# Patient Record
Sex: Male | Born: 1935 | Race: White | Hispanic: No | Smoking: Former smoker
Health system: Southern US, Community
[De-identification: ages and names within clinical notes are randomized; demographics above are authoritative.]

## PROBLEM LIST (undated history)

## (undated) DIAGNOSIS — N400 Enlarged prostate without lower urinary tract symptoms: Secondary | ICD-10-CM

## (undated) DIAGNOSIS — I10 Essential (primary) hypertension: Secondary | ICD-10-CM

## (undated) DIAGNOSIS — R194 Change in bowel habit: Secondary | ICD-10-CM

## (undated) DIAGNOSIS — D649 Anemia, unspecified: Secondary | ICD-10-CM

## (undated) DIAGNOSIS — D3A019 Benign carcinoid tumor of the small intestine, unspecified portion: Secondary | ICD-10-CM

## (undated) DIAGNOSIS — I7090 Unspecified atherosclerosis: Secondary | ICD-10-CM

## (undated) DIAGNOSIS — M48061 Spinal stenosis, lumbar region without neurogenic claudication: Secondary | ICD-10-CM

## (undated) DIAGNOSIS — K55059 Acute (reversible) ischemia of intestine, part and extent unspecified: Secondary | ICD-10-CM

## (undated) DIAGNOSIS — M1990 Unspecified osteoarthritis, unspecified site: Secondary | ICD-10-CM

## (undated) DIAGNOSIS — N19 Unspecified kidney failure: Secondary | ICD-10-CM

## (undated) HISTORY — DX: Benign prostatic hyperplasia without lower urinary tract symptoms: N40.0

## (undated) HISTORY — PX: SMALL INTESTINE SURGERY: SHX150

## (undated) HISTORY — DX: Unspecified kidney failure: N19

## (undated) HISTORY — DX: Change in bowel habit: R19.4

## (undated) HISTORY — DX: Unspecified osteoarthritis, unspecified site: M19.90

## (undated) HISTORY — DX: Anemia, unspecified: D64.9

## (undated) HISTORY — PX: LUMBAR LAMINECTOMY: SHX95

---

## 1998-04-08 ENCOUNTER — Ambulatory Visit: Admission: RE | Admit: 1998-04-08 | Discharge: 1998-04-08 | Payer: Self-pay | Admitting: Family Medicine

## 2000-07-09 ENCOUNTER — Encounter: Payer: Self-pay | Admitting: Emergency Medicine

## 2000-07-09 ENCOUNTER — Emergency Department (HOSPITAL_COMMUNITY): Admission: EM | Admit: 2000-07-09 | Discharge: 2000-07-09 | Payer: Self-pay | Admitting: Emergency Medicine

## 2000-07-17 ENCOUNTER — Encounter: Admission: RE | Admit: 2000-07-17 | Discharge: 2000-07-17 | Payer: Self-pay | Admitting: Orthopedic Surgery

## 2000-07-17 ENCOUNTER — Encounter: Payer: Self-pay | Admitting: Orthopedic Surgery

## 2001-04-19 ENCOUNTER — Encounter: Payer: Self-pay | Admitting: Neurosurgery

## 2001-04-19 ENCOUNTER — Encounter: Admission: RE | Admit: 2001-04-19 | Discharge: 2001-04-19 | Payer: Self-pay | Admitting: Neurosurgery

## 2001-12-26 ENCOUNTER — Encounter: Payer: Self-pay | Admitting: Neurosurgery

## 2001-12-26 ENCOUNTER — Encounter: Admission: RE | Admit: 2001-12-26 | Discharge: 2001-12-26 | Payer: Self-pay | Admitting: Neurosurgery

## 2002-01-25 ENCOUNTER — Encounter: Admission: RE | Admit: 2002-01-25 | Discharge: 2002-01-25 | Payer: Self-pay | Admitting: Neurosurgery

## 2002-01-25 ENCOUNTER — Encounter: Payer: Self-pay | Admitting: Neurosurgery

## 2002-02-06 ENCOUNTER — Encounter: Admission: RE | Admit: 2002-02-06 | Discharge: 2002-02-06 | Payer: Self-pay | Admitting: Neurosurgery

## 2002-02-06 ENCOUNTER — Encounter: Payer: Self-pay | Admitting: Neurosurgery

## 2002-02-21 ENCOUNTER — Encounter: Payer: Self-pay | Admitting: Neurosurgery

## 2002-02-21 ENCOUNTER — Encounter: Admission: RE | Admit: 2002-02-21 | Discharge: 2002-02-21 | Payer: Self-pay | Admitting: Neurosurgery

## 2003-03-09 ENCOUNTER — Encounter: Admission: RE | Admit: 2003-03-09 | Discharge: 2003-03-09 | Payer: Self-pay | Admitting: Family Medicine

## 2003-03-09 ENCOUNTER — Encounter: Payer: Self-pay | Admitting: Family Medicine

## 2003-11-11 ENCOUNTER — Ambulatory Visit (HOSPITAL_COMMUNITY): Admission: RE | Admit: 2003-11-11 | Discharge: 2003-11-11 | Payer: Self-pay | Admitting: Family Medicine

## 2004-01-03 ENCOUNTER — Ambulatory Visit (HOSPITAL_COMMUNITY): Admission: RE | Admit: 2004-01-03 | Discharge: 2004-01-03 | Payer: Self-pay | Admitting: Neurology

## 2006-04-13 ENCOUNTER — Ambulatory Visit (HOSPITAL_COMMUNITY): Admission: RE | Admit: 2006-04-13 | Discharge: 2006-04-13 | Payer: Self-pay | Admitting: Neurosurgery

## 2006-08-03 ENCOUNTER — Inpatient Hospital Stay (HOSPITAL_COMMUNITY): Admission: RE | Admit: 2006-08-03 | Discharge: 2006-08-04 | Payer: Self-pay | Admitting: Neurosurgery

## 2008-03-20 ENCOUNTER — Encounter: Admission: RE | Admit: 2008-03-20 | Discharge: 2008-03-20 | Payer: Self-pay | Admitting: Neurosurgery

## 2009-04-15 ENCOUNTER — Encounter: Admission: RE | Admit: 2009-04-15 | Discharge: 2009-04-15 | Payer: Self-pay | Admitting: Family Medicine

## 2009-05-15 ENCOUNTER — Ambulatory Visit (HOSPITAL_COMMUNITY): Admission: RE | Admit: 2009-05-15 | Discharge: 2009-05-15 | Payer: Self-pay | Admitting: Family Medicine

## 2009-05-16 ENCOUNTER — Encounter: Admission: RE | Admit: 2009-05-16 | Discharge: 2009-05-28 | Payer: Self-pay | Admitting: Family Medicine

## 2011-01-30 NOTE — Op Note (Signed)
Calvin Harper, Calvin Harper             ACCOUNT NO.:  [id number]   MEDICAL RECORD NO.:  [id number]          PATIENT TYPE:  INP   LOCATION:  3172                         FACILITY:  [facility]   PHYSICIAN:  Reinaldo Meeker, M.D. DATE OF BIRTH:  08/23/1916   DATE OF PROCEDURE:  08/03/2006  DATE OF DISCHARGE:                                 OPERATIVE REPORT   PREOPERATIVE DIAGNOSIS:  Spinal stenosis L4-5.   POSTOPERATIVE DIAGNOSIS:  Spinal stenosis L4-5.   PROCEDURE:  Bilateral L4-5 decompressive laminectomy.   SURGEON:  Reinaldo Meeker, M.D.   ASSISTANT:  Tia Alert, MD   PROCEDURE IN DETAIL:  After placed in the prone position, the patient's back  was prepped and draped in the usual sterile fashion.  Localizing x-rays  taken prior to incision identify the appropriate level.  Midline incision  was made over the spinous process of L4 and L5.  Using Bovie cutting  current, the incision was carried down to spinous processes.  Subperiosteal  dissection was then carried out bilaterally on the spinous processes,  lamina, facet joints.  Self-retaining retractor was placed for exposure.  X-  rays showed approach to the appropriate level.  Using Stille rongeurs,  spinous process interspinous ligament were removed.  Started the patient's  left side laminotomy was performed by removing the inferior one half of the  L4 lamina, medial one third of facet joint.  The superior one half of the L4  lamina.  Residual bone and markedly hypertrophic ligamentum flavum were  removed to complete the decompression of the left side thecal sac and L5  nerve root.  Attention was then turned to the right side where once again,  similar decompression was carried out.  Once again high-speed drill was used  to thin the bone followed by removal of residual bone and ligamentum flavum  with Kerrison punch.  L5 nerve root was once again followed out its foramen.  At this time disks were evaluated and found to be bulging  but not need of  removal.  This time the decompression was felt to be complete.  Large  amounts of the irrigation were carried out.  Any bleeding was controlled  bipolar coagulation and Gelfoam.  The wound was then closed in multiple  layers of Vicryl in the muscle fascia, subcutaneous and subcu tissues and  staples on the skin.  A sterile dressing was then applied and the patient  was extubated taken to recovery room in stable condition.           ______________________________  Reinaldo Meeker, M.D.     ROK/MEDQ  D:  08/03/2006  T:  08/03/2006  Job:  [id number]

## 2011-02-21 ENCOUNTER — Inpatient Hospital Stay (HOSPITAL_COMMUNITY)
Admission: EM | Admit: 2011-02-21 | Discharge: 2011-03-04 | DRG: 329 | Disposition: A | Discharge status: Home Health | Payer: Medicare Other | Attending: Surgery | Admitting: Surgery

## 2011-02-21 ENCOUNTER — Emergency Department (HOSPITAL_COMMUNITY): Payer: Medicare Other

## 2011-02-21 DIAGNOSIS — R5381 Other malaise: Secondary | ICD-10-CM | POA: Diagnosis not present

## 2011-02-21 DIAGNOSIS — C7A012 Malignant carcinoid tumor of the ileum: Secondary | ICD-10-CM | POA: Diagnosis present

## 2011-02-21 DIAGNOSIS — R188 Other ascites: Secondary | ICD-10-CM | POA: Diagnosis present

## 2011-02-21 DIAGNOSIS — K560 Paralytic ileus: Secondary | ICD-10-CM | POA: Diagnosis not present

## 2011-02-21 DIAGNOSIS — K559 Vascular disorder of intestine, unspecified: Principal | ICD-10-CM | POA: Diagnosis present

## 2011-02-21 DIAGNOSIS — F05 Delirium due to known physiological condition: Secondary | ICD-10-CM | POA: Diagnosis not present

## 2011-02-21 DIAGNOSIS — Z79899 Other long term (current) drug therapy: Secondary | ICD-10-CM

## 2011-02-21 DIAGNOSIS — Z781 Physical restraint status: Secondary | ICD-10-CM | POA: Diagnosis not present

## 2011-02-21 DIAGNOSIS — K5900 Constipation, unspecified: Secondary | ICD-10-CM | POA: Diagnosis present

## 2011-02-21 DIAGNOSIS — I2510 Atherosclerotic heart disease of native coronary artery without angina pectoris: Secondary | ICD-10-CM | POA: Diagnosis present

## 2011-02-21 DIAGNOSIS — N289 Disorder of kidney and ureter, unspecified: Secondary | ICD-10-CM | POA: Diagnosis present

## 2011-02-21 DIAGNOSIS — K658 Other peritonitis: Secondary | ICD-10-CM | POA: Diagnosis present

## 2011-02-21 DIAGNOSIS — N400 Enlarged prostate without lower urinary tract symptoms: Secondary | ICD-10-CM | POA: Diagnosis present

## 2011-02-21 DIAGNOSIS — I10 Essential (primary) hypertension: Secondary | ICD-10-CM | POA: Diagnosis present

## 2011-02-21 DIAGNOSIS — R1310 Dysphagia, unspecified: Secondary | ICD-10-CM | POA: Diagnosis present

## 2011-02-21 DIAGNOSIS — I700 Atherosclerosis of aorta: Secondary | ICD-10-CM | POA: Diagnosis present

## 2011-02-21 DIAGNOSIS — Z880 Allergy status to penicillin: Secondary | ICD-10-CM

## 2011-02-21 LAB — URINALYSIS, ROUTINE W REFLEX MICROSCOPIC
Nitrite: NEGATIVE
Protein, ur: NEGATIVE mg/dL
Urobilinogen, UA: 1 mg/dL (ref 0.0–1.0)
pH: 5 (ref 5.0–8.0)

## 2011-02-21 LAB — COMPREHENSIVE METABOLIC PANEL
Alkaline Phosphatase: 73 U/L (ref 39–117)
BUN: 42 mg/dL — ABNORMAL HIGH (ref 6–23)
CO2: 25 mEq/L (ref 19–32)
Chloride: 101 mEq/L (ref 96–112)
Creatinine, Ser: 1.52 mg/dL — ABNORMAL HIGH (ref 0.4–1.5)
GFR calc Af Amer: 52 mL/min — ABNORMAL LOW (ref >60)
GFR calc non Af Amer: 43 mL/min — ABNORMAL LOW (ref >60)
Glucose, Bld: 147 mg/dL — ABNORMAL HIGH (ref 70–99)
Potassium: 4.2 mEq/L (ref 3.5–5.1)
Total Bilirubin: 0.5 mg/dL (ref 0.3–1.2)

## 2011-02-21 LAB — DIFFERENTIAL
Basophils Absolute: 0 10*3/uL (ref 0.0–0.1)
Lymphocytes Relative: 7 % — ABNORMAL LOW (ref 12–46)
Lymphs Abs: 0.9 10*3/uL (ref 0.7–4.0)
Monocytes Absolute: 0.9 10*3/uL (ref 0.1–1.0)
Neutro Abs: 9.8 10*3/uL — ABNORMAL HIGH (ref 1.7–7.7)

## 2011-02-21 LAB — CBC
HCT: 40.3 % (ref 39.0–52.0)
Hemoglobin: 13.1 g/dL (ref 13.0–17.0)
MCV: 92 fL (ref 78.0–100.0)
WBC: 11.6 10*3/uL — ABNORMAL HIGH (ref 4.0–10.5)

## 2011-02-21 LAB — LIPASE, BLOOD: Lipase: 32 U/L (ref 11–59)

## 2011-02-21 LAB — URINE MICROSCOPIC-ADD ON

## 2011-02-21 MED ORDER — IOHEXOL 300 MG/ML  SOLN
80.0000 mL | Freq: Once | INTRAMUSCULAR | Status: AC | PRN
  Administered 2011-02-21: 80 mL via INTRAVENOUS

## 2011-02-22 ENCOUNTER — Other Ambulatory Visit: Payer: Self-pay | Admitting: General Surgery

## 2011-02-22 LAB — BASIC METABOLIC PANEL
BUN: 40 mg/dL — ABNORMAL HIGH (ref 6–23)
Creatinine, Ser: 1.35 mg/dL (ref 0.4–1.5)
GFR calc non Af Amer: 49 mL/min — ABNORMAL LOW (ref >60)
Glucose, Bld: 209 mg/dL — ABNORMAL HIGH (ref 70–99)
Potassium: 4.5 mEq/L (ref 3.5–5.1)

## 2011-02-22 LAB — CBC
HCT: 37.2 % — ABNORMAL LOW (ref 39.0–52.0)
Hemoglobin: 12.1 g/dL — ABNORMAL LOW (ref 13.0–17.0)
MCHC: 32.5 g/dL (ref 30.0–36.0)
MCV: 92.1 fL (ref 78.0–100.0)

## 2011-02-23 ENCOUNTER — Other Ambulatory Visit (INDEPENDENT_AMBULATORY_CARE_PROVIDER_SITE_OTHER): Payer: Self-pay | Admitting: Surgery

## 2011-02-23 LAB — CBC
HCT: 33.5 % — ABNORMAL LOW (ref 39.0–52.0)
MCV: 91 fL (ref 78.0–100.0)
RBC: 3.68 MIL/uL — ABNORMAL LOW (ref 4.22–5.81)
RDW: 14.8 % (ref 11.5–15.5)
WBC: 11 10*3/uL — ABNORMAL HIGH (ref 4.0–10.5)

## 2011-02-23 LAB — COMPREHENSIVE METABOLIC PANEL
BUN: 31 mg/dL — ABNORMAL HIGH (ref 6–23)
CO2: 23 mEq/L (ref 19–32)
Chloride: 103 mEq/L (ref 96–112)
Creatinine, Ser: 1.15 mg/dL (ref 0.4–1.5)
GFR calc non Af Amer: 59 mL/min — ABNORMAL LOW (ref >60)
Total Bilirubin: 0.3 mg/dL (ref 0.3–1.2)

## 2011-02-24 LAB — CBC
MCH: 30.8 pg (ref 26.0–34.0)
Platelets: 183 10*3/uL (ref 150–400)
RBC: 3.89 MIL/uL — ABNORMAL LOW (ref 4.22–5.81)
WBC: 8.6 10*3/uL (ref 4.0–10.5)

## 2011-02-24 LAB — DIFFERENTIAL
Basophils Absolute: 0 10*3/uL (ref 0.0–0.1)
Eosinophils Absolute: 0 10*3/uL (ref 0.0–0.7)
Lymphocytes Relative: 7 % — ABNORMAL LOW (ref 12–46)
Neutrophils Relative %: 85 % — ABNORMAL HIGH (ref 43–77)

## 2011-02-24 LAB — BASIC METABOLIC PANEL
CO2: 24 mEq/L (ref 19–32)
Calcium: 8.8 mg/dL (ref 8.4–10.5)
Chloride: 98 mEq/L (ref 96–112)
Sodium: 129 mEq/L — ABNORMAL LOW (ref 135–145)

## 2011-02-25 LAB — BASIC METABOLIC PANEL
BUN: 13 mg/dL (ref 6–23)
CO2: 29 mEq/L (ref 19–32)
Chloride: 99 mEq/L (ref 96–112)
Creatinine, Ser: 1.01 mg/dL (ref 0.4–1.5)
GFR calc Af Amer: >60 mL/min (ref >60)
Potassium: 4.4 mEq/L (ref 3.5–5.1)

## 2011-02-25 LAB — CBC
HCT: 38.3 % — ABNORMAL LOW (ref 39.0–52.0)
MCV: 91.2 fL (ref 78.0–100.0)
RBC: 4.2 MIL/uL — ABNORMAL LOW (ref 4.22–5.81)
RDW: 14.1 % (ref 11.5–15.5)
WBC: 13.4 10*3/uL — ABNORMAL HIGH (ref 4.0–10.5)

## 2011-02-25 LAB — MAGNESIUM: Magnesium: 2.1 mg/dL (ref 1.5–2.5)

## 2011-02-25 NOTE — Op Note (Signed)
  NAMECODEN, FRANCHI NO.:  [id number]  MEDICAL RECORD NO.:  [id number]  LOCATION:  1227                         FACILITY:  [facility]  PHYSICIAN:  Thornton Park. Daphine Deutscher, MD  DATE OF BIRTH:  [birth year]  DATE OF PROCEDURE:  02/23/2011 DATE OF DISCHARGE:                              OPERATIVE REPORT   PREOPERATIVE INDICATIONS:  This is a 77 year old Caucasian man who underwent E-LAP by Dr. Gaynelle Adu on February 22, 2011, at which time he resected some ischemic small bowel and the jejunum and found a distal mass in his ileum.  The mass was left marked with a suture where there was a puckered scar.  The patient is brought back at this time for E-LAP and reanastomosis.  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  SURGEON:  Thornton Park. Daphine Deutscher, MD  ASSISTANT:  None.  ANESTHESIA:  General endotracheal.  DESCRIPTION OF PROCEDURE:  The patient was taken to room #1 on the evening of February 23, 2011.  Thereafter, general anesthesia was administered.  The wound which had wicks in it was opened by removing the double-stranded PDS and the abdomen was entered.  I ran the bowel and found the 2 ends of the proximal bowel and then went distally and found the puckered mass in the terminal ileum.  I went at that point and described an anastomosis distally by bending the puckered area into a U and going downstream on both legs firing GIA stapler.  I then resected the common defect and also resected the mass with a single application TA-55 blue load.  I resected the mesentery to go with that mass and oversewed with 2-0 silks.  Viable anastomosis was present although I could see some little bit of cyanosis on the tips proximally.  However, it was felt it was viable.  I looked at it in the case and it still remained  viable.  I went upstream to the 2 ends of bowel and first I closed the mesenteric defect with running 2-0 silk completely closing that.  I then aligned the bowel along the antimesenteric borders opening and I did resect a little piece of the of the proximal bowel that looked a little more ischemic with a GIA.  I then lined up to bowel, fired it GIA and close the common defect in 2 layers with 4-0 PDS with interrupted 3-0 silks. Sponge and needle counts were reported as correct.  I then irrigated and closed the abdomen with running double- stranded #1 PDS from either end.  The wound was irrigated and wound was closed in vertical mattress sutures of nylon and with Telfa wicks.  The patient was taken to recovery room in satisfactory condition.     Thornton Park Daphine Deutscher, MD     MBM/MEDQ  D:  02/23/2011  T:  02/23/2011  Job:  [id number]  Electronically Signed by Luretha Murphy MD on 02/25/2011 04:34:42 PM

## 2011-02-26 LAB — BASIC METABOLIC PANEL
CO2: 28 mEq/L (ref 19–32)
Chloride: 101 mEq/L (ref 96–112)
Glucose, Bld: 109 mg/dL — ABNORMAL HIGH (ref 70–99)
Sodium: 135 mEq/L (ref 135–145)

## 2011-02-26 LAB — CBC
Hemoglobin: 11 g/dL — ABNORMAL LOW (ref 13.0–17.0)
MCH: 30.1 pg (ref 26.0–34.0)
MCV: 89.3 fL (ref 78.0–100.0)
Platelets: 207 10*3/uL (ref 150–400)
RBC: 3.66 MIL/uL — ABNORMAL LOW (ref 4.22–5.81)
WBC: 14 10*3/uL — ABNORMAL HIGH (ref 4.0–10.5)

## 2011-02-26 LAB — MAGNESIUM: Magnesium: 2 mg/dL (ref 1.5–2.5)

## 2011-02-26 LAB — PHOSPHORUS: Phosphorus: 1.9 mg/dL — ABNORMAL LOW (ref 2.3–4.6)

## 2011-02-27 ENCOUNTER — Inpatient Hospital Stay (HOSPITAL_COMMUNITY): Payer: Medicare Other

## 2011-02-27 LAB — CBC
HCT: 36 % — ABNORMAL LOW (ref 39.0–52.0)
Hemoglobin: 12.2 g/dL — ABNORMAL LOW (ref 13.0–17.0)
RBC: 4.02 MIL/uL — ABNORMAL LOW (ref 4.22–5.81)

## 2011-02-27 LAB — URINALYSIS, ROUTINE W REFLEX MICROSCOPIC
Bilirubin Urine: NEGATIVE
Leukocytes, UA: NEGATIVE
Nitrite: NEGATIVE
Specific Gravity, Urine: 1.017 (ref 1.005–1.030)
Urobilinogen, UA: 1 mg/dL (ref 0.0–1.0)
pH: 6.5 (ref 5.0–8.0)

## 2011-02-27 LAB — PHOSPHORUS: Phosphorus: 3.3 mg/dL (ref 2.3–4.6)

## 2011-02-27 LAB — MAGNESIUM: Magnesium: 2.2 mg/dL (ref 1.5–2.5)

## 2011-02-28 LAB — CBC
MCH: 29.6 pg (ref 26.0–34.0)
Platelets: 294 10*3/uL (ref 150–400)
RBC: 3.85 MIL/uL — ABNORMAL LOW (ref 4.22–5.81)
WBC: 17.4 10*3/uL — ABNORMAL HIGH (ref 4.0–10.5)

## 2011-02-28 LAB — URINE CULTURE
Colony Count: NO GROWTH
Special Requests: NEGATIVE

## 2011-03-10 ENCOUNTER — Other Ambulatory Visit (INDEPENDENT_AMBULATORY_CARE_PROVIDER_SITE_OTHER): Payer: Self-pay | Admitting: General Surgery

## 2011-03-11 NOTE — Discharge Summary (Signed)
NAMEHAMLET, LASECKI NO.:  [id number]  MEDICAL RECORD NO.:  [id number]  LOCATION:  1524                         FACILITY:  [facility]  PHYSICIAN:  Juanetta Gosling, MDDATE OF BIRTH:  [birth year]  DATE OF ADMISSION:  02/21/2011 DATE OF DISCHARGE:  03/04/2011                              DISCHARGE SUMMARY   PRIMARY CARE PHYSICIAN:  Dr. Lupe Carney.  CONSULTING SURGEONS:  Dr. Gaynelle Adu and Dr. Luretha Murphy.  HISTORY OF PRESENT ILLNESS:  Mr. Rumbold is a pleasant 77 year old gentleman with history of chronic constipation.  He was otherwise doing well when he developed some periumbilical pain.  Pain made him weak all over and became more severe and he called EMS to bring him to the hospital.  He developed some nausea and vomiting in addition to this. He was seen in the emergency department and was worked up,  which workup was abnormal showing some mild renal insufficiency, mild leukocytosis and subsequently imaging showed some portal venous gas, dilatation of the small bowel loops with some wall thickening and pneumatosis concerning for ischemic bowel.  Dr. Andrey Campanile evaluated the patient and felt that the patient would need admission and urgent surgical intervention.  He discussed this at length with the patient and the patient's family and decision was made to proceed to the operating room.  SUMMARY OF HOSPITAL COURSE:  The patient was admitted on February 21, 2011. Late in the evening, he was taken to the operating room and underwent exploratory laparotomy with small bowel resection due to ischemic bowel. The patient was found to have a segment of small bowel that was ischemic that was resected and stapled off.  He was additionally found to have a mass lesion in the distal small bowel.  It was felt that the patient would not be anastomosed at that time but would be closed and taken to the intensive care unit for continued resuscitation.  The patient tolerated  that procedure well.  A day later, the patient was taken back to the operating room by Dr. Daphine Deutscher for a second look exploratory laparotomy.  At this time, the distal small bowel mass was resected, was found to be carcinoid by frozen section.  There were no additional areas of ischemic bowel and therefore anastomosis was created of the 2 ends of the stapled off jejunum.  The patient again tolerated this procedure well and was admitted to the intensive care unit in stable condition. Postoperatively the patient did develop some postoperative ileus that gradually improved.  However, approximately postop day #4, the patient developed some acute delirium possibly secondary to pain medication versus ICU psychosis due to his age.  However, this gradually improved with time, cessation of any narcotic medication.  The patient's mental status recovered to baseline.  He appeared to suffer no significant sequelae.  However, he did suffer some significant deconditioning during his postoperative course.  Therefore, physical therapy and occupational therapy have been consulted.  They felt that the patient could go home. He was appropriate for home health physical therapy.  The patient's family members have agreed to observe him and provide 24-hour assistance of care until he can be independent again.  Therefore arrangements have been  made for home health physical therapy, occupational therapy, rolling walker and home health wound care to continue dressing changes to his abdominal wound.  At this point, the patient is tolerating regular diet and had good bowel function.  He we feel is appropriate for discharge as of today.  DISCHARGE DIAGNOSES: 1. Ischemic small bowel status post partial small bowel resection. 2. Small bowel carcinoid status post resection. 3. Postoperative ileus - resolved. 4. Acute delirium - resolved. 5. Deconditioning.  The patient will continue to receive home health     physical  therapy and occupational therapy. 6. Chronic dysphagia.  The patient had a speech therapy evaluation and     is tolerating mechanical soft diet without too much difficulty and     this has been a chronic problem for him and he will continue on     this diet for the remainder.  DISCHARGE MEDICATIONS:  The patient is going to resume home medications including: 1. Aspirin 81 mg daily. 2. Finasteride 5 mg daily. 3. Metamucil powder p.r.n. constipation. 4. Nasonex nasal spray 2 sprays daily. 5. Tamsulosin 0.4 mg daily. 6. Verapamil SR 240 mg daily. 7. Vitamin D 1 tablet twice daily.  He is encouraged to use Tylenol 1 to 2 tablets q.4-6 h. p.r.n. pain.  He was previously taking naproxen and is advised not to use this but on a sparingly p.r.n. basis.  He is given preprinted discharge instructions and will follow up in approximately 7 to 10 days with Dr. Daphine Deutscher for reevaluation and probable staple removal.     Brayton El, PA-C   ______________________________ Juanetta Gosling, MD    KB/MEDQ  D:  03/04/2011  T:  03/04/2011  Job:  [id number]  Electronically Signed by Brayton El  on 03/09/2011 02:45:05 PM Electronically Signed by Emelia Loron MD on 03/11/2011 08:20:43 PM

## 2011-03-12 ENCOUNTER — Encounter (INDEPENDENT_AMBULATORY_CARE_PROVIDER_SITE_OTHER): Payer: Self-pay | Admitting: Surgery

## 2011-03-12 ENCOUNTER — Ambulatory Visit (INDEPENDENT_AMBULATORY_CARE_PROVIDER_SITE_OTHER): Payer: Medicare Other | Admitting: Surgery

## 2011-03-12 VITALS — BP 106/68 | HR 74 | Temp 97.5°F | Resp 16 | Ht 75.0 in | Wt 164.6 lb

## 2011-03-12 DIAGNOSIS — D3A098 Benign carcinoid tumors of other sites: Secondary | ICD-10-CM

## 2011-03-12 DIAGNOSIS — K559 Vascular disorder of intestine, unspecified: Secondary | ICD-10-CM

## 2011-03-12 DIAGNOSIS — D490 Neoplasm of unspecified behavior of digestive system: Secondary | ICD-10-CM

## 2011-03-12 NOTE — Progress Notes (Signed)
Manpreet Strey is a 77 year old white male who was recently discharged from [facility] after undergoing an exploratory laparotomy by Dr. Salome Arnt removal of ischemic small bowel with a subsequent take back on February 23, 2011 4 a second look exploratory laparotomy, resection of an area of the terminal ileum which proved to be an ileal carcinoid, and re\re anastomosis of the previous resected area done by Dr. Andrey Campanile.The patient has done well postoperatively and despite his 77 years of age.He has tooth 2 areas of h that are open and healing in by secondary intention.These were left that way to heal by secondary intention.  Today I removed his vertical mattresssutures. His daughter will help clean the wound twice a day with proximal and put a light dressing on it. I told him he could eat whatever he wanted to just to chew his food wellto try to avoid aspiration.I will see him back in the office in 6 weeks and followup.  Overall I think Mr. Greenleaf is doing wellafter surgery.

## 2011-03-17 ENCOUNTER — Ambulatory Visit (INDEPENDENT_AMBULATORY_CARE_PROVIDER_SITE_OTHER): Payer: Medicare Other | Admitting: Surgery

## 2011-03-17 ENCOUNTER — Encounter (INDEPENDENT_AMBULATORY_CARE_PROVIDER_SITE_OTHER): Payer: Self-pay | Admitting: Surgery

## 2011-03-17 VITALS — Temp 97.0°F

## 2011-03-17 DIAGNOSIS — Z9889 Other specified postprocedural states: Secondary | ICD-10-CM

## 2011-03-25 NOTE — Op Note (Signed)
NAMEROBEL, WUERTZ NO.:  [id number]  MEDICAL RECORD NO.:  [id number]  LOCATION:  WLED                         FACILITY:  [facility]  PHYSICIAN:  Mary Sella. Andrey Campanile, MD     DATE OF BIRTH:  10-26-1915  DATE OF PROCEDURE:  02/22/2011 DATE OF DISCHARGE:                              OPERATIVE REPORT   PREOPERATIVE DIAGNOSIS: 1. Ischemic small bowel. 2.  POSTOPERATIVE DIAGNOSES: 1. Ischemic small bowel. 2. Distal small bowel mass.  PROCEDURE PERFORMED: 1. Exploratory laparotomy. 2. Small-bowel resection without anastomosis.  SURGEON:  Mary Sella.  Andrey Campanile, M.D.  ASSISTANT SURGEON:  None.  ANESTHESIA:  General.  SPECIMEN:  Mid jejunum, which was sent to pathology.  ESTIMATED BLOOD LOSS:  Minimal.  FINDINGS:  Approximately 50 cm distal to ligament of Treitz, there was a change in the small bowel.  The wall was thickened and hyperemic.  The mesentery was thickened and edematous as well.  The length of this abnormality was about 45 cm, therefore this was resected.  There was also some hyperemia of the bowel wall proximal to this; however, the mesentery was normal, but because the bowel wall was hyperemic, I elected to not reconnect the patient as I found that it would be best to do a second look to monitor for any signs of ongoing ischemia.  In running the small bowel, there is a focal area of stricture secondary to an intrinsic mass within the lumen.  I marked this with a 3-0 silk stitch.  This area will need to be resected when he is brought back to the operating room.  INDICATIONS FOR PROCEDURE:  Patient is a fairly healthy 77 year old gentleman, who has about a 48-50 years history of chronic constipation, who was in his usual state of health until earlier today.  He ate some hamburgers and then within an hour after eating an hamburger, he developed severe periumbilical pain.  It made him extremely weak.  He had never had any pain like that before.  He called  911.  Upon EMS's arrival, he has also had some nausea and vomiting.  He was brought to the ER where he was found to have a white count around 12,000.  He also had an elevated creatinine of around 1.5.  He underwent a CT scan of his abdomen and pelvis, which demonstrated poor venous gas in the left side of liver, ascites and a focal area of mid small bowel that had wall thickening and pneumatosis.  He also had localized peritonitis on exam. I recommended exploratory surgery to the patient and to his family.  He had diffuse calcification in his abdominal vasculature, although the celiac, SMA and IMA were patent.  There is diffuse atherosclerosis, so my working diagnosis that he had showered a piece of plaque into the small bowel mesentery.  We discussed the risks and benefits of surgery including, but not limited to bleeding, infection, injury to surrounding structures, need to return to the operating room for second look, anastomotic stricture, anastomotic leakage, incisional hernia, wound complications, DVT occurrence, perioperative and cardiac events as well as pneumonia and renal failure as well as the possibility of death.  The patient elected to proceed with  surgery.  DESCRIPTION OF PROCEDURE:  After obtaining informed consent, the patient was brought to the operating room and placed supine on the operating table.  General endotracheal anesthesia was established.  Sequential compression devices were placed.  The patient already had a Foley catheter placed in the ER.  He received Invanz prior to skin incision. His abdomen was prepped and draped in usual standard surgical fashion with ChloraPrep.  Surgical time-out was performed.  The mini midline incision was made with the #10 blade.  The subcutaneous tissue was divided with the electrocautery.  The fascia was incised and the abdominal cavity was entered.  I placed an Horticulturist, commercial.  The omentum was lifted up.  He had diffuse  ascites.  It was green-tinged. There was no enteric contents, however.  I lifted up the omentum and started running the ligament of Treitz.  As described above, approximately 50 cm from the ligament Treitz the bowel became hyperemic and the wall became diffusely thickened and the mesentery was thickened in this area as well.  There was biphasic Doppler signal in this area of abnormality in the mesentery; however, the caliber of the signal was different from the rest of the small bowel.  The length of this hyperemic bowel was around 45 cm.  Then the bowel appeared normal.  Upon running the distal small bowel, there is a focal area of stricture in the distal small bowel that was focal.  There was an intrinsic small bowel mass within the lumen.  The colon appeared normal.  The liver appeared normal.  The stomach was distended.  Because of the hyperemic and thickened bowel wall and thickened mesentery, I elected to resect the midportion of the small bowel.  A small rent was made in the mesentery until where the mesentery appeared normal.  I divided bowel with a linear cutter GIA stapler 65-mm blue load.  I then took down the mesentery in a sterile fashion using LigaSure device.  Distally, I divided the small bowel in a similar fashion with a blue load.  The two stapled ends of the small bowel were tacked with a 3-0 silk sutures. The proximal small bowel, there is about 4-6 cm of the wall on one side that appeared hyperemic; however, the mesentery appeared normal. Because of the hyperemic appearance, I elected to leave him stapled off, expecting that he would be best served by having a second look in 48 hours to rule out any ongoing signs of ischemia.  I also elected to leave the distal small bowel mass until his definitive surgery.  The abdomen was copiously irrigated.  I did tack the area of the small bowel stricture with the mass within the lumen with the 3-0 silk suture.  I confirmed  placement of the nasogastric tube in the mid stomach. Seprafilm was then placed on top of the omentum after it had been draped over the small bowel.  I then reapproximated the fascia with two running looped #1 PDS sutures one from above and one from below.  The skin was reapproximated loosely with skin staples and Telfa wicks were placed between the skin staples followed by 4x4s, ABD and tape.  The patient was extubated and taken to recovery room in stable addition.  All needle and instrument counts were correct x2.  There are no immediate complications.  The patient will need to be brought back within the operating room in 48 hours for second look and further evaluation.     Mary Sella.  Andrey Campanile, MD     EMW/MEDQ  D:  02/22/2011  T:  02/22/2011  Job:  [id number]  cc:   August Saucer Dr Clovis Riley  Electronically Signed by Gaynelle Adu M.D. on 03/25/2011 09:03:23 AM

## 2011-03-25 NOTE — H&P (Signed)
**Note Calvin Harper** Calvin Harper NO.:  [id number]  MEDICAL RECORD NO.:  [id number]  LOCATION:  1227                         FACILITY:  [facility]  PHYSICIAN:  Mary Sella. Andrey Campanile, MD     DATE OF BIRTH:  1916-07-02  DATE OF ADMISSION:  02/21/2011 DATE OF DISCHARGE:                             HISTORY & PHYSICAL   ADMITTING SERVICE:  [facility].  PRIMARY CARE PHYSICIAN:  L. Lupe Carney, M.D.  REASON FOR ADMISSION:  Ischemic bowel.  CHIEF COMPLAINT:  Abdominal pain.  HISTORY OF PRESENT ILLNESS:  Mr. Calvin Harper is a very pleasant 77 year old Caucasian male with a history of chronic constipation.  He was in his usual state of health until earlier today.  He decided that he was "going to do something for his chronic constipation," so he went and got some Metamucil and took a dose.  He subsequently had a hamburger a few hours later.  About an hour after eating his hamburger, he developed severe periumbilical pain.  He described it as constant, sharp.  He said it made him weak all over.  He called 911.  When EMS arrived, he had multiple episodes of nausea and emesis.  He was brought to [facility] Emergency Department for evaluation.  He denies any prior symptoms.  He denies any food fear.  He denies any amaurosis fugax or TIAs.  He denies any history of atrial fibrillation.  He denies any weight loss, hematuria, melena, or hematochezia.  He says he has about a bowel movement every 10 days.  It is very hard for him to have a bowel movement.  He has had chronic constipation since returning from [place], he says .  He does endorse some nocturia.  He does not take any blood thinners other than a low-dose aspirin.  PAST MEDICAL HISTORY: 1. Hypertension. 2. BPH. 3. Chronic constipation.  PAST SURGICAL HISTORY: 1. L4-5 laminectomy. 2. Tonsillectomy.  ALLERGIES:  PENICILLIN.  HOME MEDICATIONS: 1. Finasteride. 2. Verapamil. 3. Flomax.  FAMILY HISTORY:   Noncontributory.  SOCIAL HISTORY:  He denies any drugs, alcohol, or tobacco.  He lives by himself.  He still drives.  REVIEW OF SYSTEMS:  He denies any chest pain, shortness of breath, orthopnea, paroxysmal nocturnal dyspnea.  He does endorse some Dupuytren contractures.  Otherwise, a comprehensive 12-point review of systems is negative except what is mentioned in the HPI.  PHYSICAL EXAMINATION:  VITAL SIGNS:  Temperature 97.5, pulse 68, blood pressure 125/50, respirations 24, satting 98% on room air. GENERAL:  White male who is lying in the stretcher, in no apparent distress. HEENT: Atraumatic, normocephalic.  Pupils are equal.  No scleral icterus.  No external ear lesions.  Hearing is decent.  He has got glasses.  He has got some dried vomitus around his mouth.  Mucous membranes look a little bit dry. NECK:  Trachea is midline.  No palpable lymphadenopathy. PULMONARY:  Lungs are clear.  Symmetric chest rise.  No accessory use of muscles. CARDIOVASCULAR:  Regular rhythm.  Palpable radial and femoral pulses, his femoral pulses are quite prominent.  No carotid bruits. ABDOMEN:  Mildly distended, soft.  There are no overt masses.  There are no  hernias.  He has got significant periumbilical pain to palpation, I would characterize it as some localized peritonitis in that area. MUSCULOSKELETAL:  Free range of motion.  Moves all extremities.  He does have a little bit of contracture of some of his fingers on his left hand. SKIN:  He has got multiple bruises in various states  on his forearm. He has got scattered nevi. NEUROLOGIC:  Nonfocal.  Sensation grossly intact. PSYCHIATRIC:  He is alert and oriented x3.  Judgment and insight appear appropriate.  LABORATORY DATA:  Urinalysis showed trace ketones, trace leukocytes, otherwise negative.  Sodium 135, potassium 4.2, chloride 101, bicarb 25, BUN 42, creatinine 1.52, blood sugar 147, calcium 9.9, total bilirubin 0.5, AST 25, ALT 10,  alkaline phosphatase 73, lipase 32.  White count 11.6, hemoglobin 13, hematocrit 40, platelet count 235,000.  RADIOGRAPHS:  CT scan of the abdomen and pelvis showed portal venous gas in the left lobe of liver.  There is mild dilation of small bowel loops with focal midabdominal loop with wall thickening and pneumatosis. There is swollen SMA, but there is calcification of the celiac, SMA, an IMA.  There is a little bit of mesenteric stranding and there is a small amount of fluid in the pelvis.  There is calcification of the aorta without aneurysm.  There is calcification in the coronary arteries.  I personally reviewed the  CT scan myself and discussed it with Radiology.  IMPRESSION:  A 77 year old Caucasian male with hypertension, BPH, chronic constipation, atherosclerotic disease, elevated creatinine, and ischemic bowel.  PLAN:  He has no history of atrial fibrillation.  It is more than likely that the ischemic bowel is due to an atherosclerotic emboli, although there is a potential of other causes.  Nonetheless, I have recommended urgent exploration to the patient and his family, given his pneumatosis, portal venous gas, small bowel wall thickening, elevated white blood cell count, and his clinical exam.  I have recommended exploratory surgery with bowel resection and possible ostomy.  I also explained it is possible that we might not be able to reconnect his intestinal tract tonight and that he might need a diverting ostomy.  I also explained to him that it is potential that he might need a second-look procedure to evaluate for ongoing signs of ischemia.  I have informed that he will be in the ICU postoperatively and it is potential that he may be on the ventilator after surgery.  We will start IV antibiotics on call for surgery and place sequential compression devices.  I had a prolonged discussion with the patient and the family regarding the seriousness of the problem. I  explained to him that surgery was the safest and most prudent option in this instance.  My concern is that if we do not operate tonight that he will perforate and develop multisystem organ failure and certainly have a very significant chance of mortality.  Even if we do operate, he is still at high risk for perioperative complications including bleeding, infection, wound complications, cardiac events, pneumonia, renal failure, and a prolonged hospitalization.  He has elected to proceed to the operating room for surgical intervention.     Mary Sella. Andrey Campanile, MD     EMW/MEDQ  D:  02/21/2011  T:  02/22/2011  Job:  [id number]  cc:   L. Lupe Carney, M.D. Fax: [phone]  Electronically Signed by Gaynelle Adu M.D. on 03/25/2011 09:03:42 AM

## 2011-04-30 ENCOUNTER — Encounter (INDEPENDENT_AMBULATORY_CARE_PROVIDER_SITE_OTHER): Payer: Self-pay | Admitting: Surgery

## 2011-04-30 ENCOUNTER — Ambulatory Visit (INDEPENDENT_AMBULATORY_CARE_PROVIDER_SITE_OTHER): Payer: Medicare Other | Admitting: Surgery

## 2011-04-30 VITALS — Temp 97.2°F

## 2011-04-30 DIAGNOSIS — D3A098 Benign carcinoid tumors of other sites: Secondary | ICD-10-CM

## 2011-04-30 DIAGNOSIS — D490 Neoplasm of unspecified behavior of digestive system: Secondary | ICD-10-CM

## 2011-04-30 NOTE — Progress Notes (Signed)
Calvin Harper returns after his small bowel resection. 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 He has healed remarkably well for his age. His incision looks beautiful. He's having a little problem with diarrhea now but I think that it is likely a reflection of his altered gut bacteria. I'll be glad to see him again whenever needed.

## 2011-05-26 NOTE — Progress Notes (Signed)
The patient comes in with a history of wound drainage.  Wound not red.  Looks like seroma has drained spontaneously.  Will see back at regularly scheduled appointment.

## 2011-06-10 ENCOUNTER — Emergency Department (HOSPITAL_BASED_OUTPATIENT_CLINIC_OR_DEPARTMENT_OTHER)
Admission: EM | Admit: 2011-06-10 | Discharge: 2011-06-10 | Disposition: A | Discharge status: Home / Self Care | Payer: Medicare Other | Attending: Emergency Medicine | Admitting: Emergency Medicine

## 2011-06-10 ENCOUNTER — Encounter (HOSPITAL_BASED_OUTPATIENT_CLINIC_OR_DEPARTMENT_OTHER): Payer: Self-pay | Admitting: *Deleted

## 2011-06-10 DIAGNOSIS — N390 Urinary tract infection, site not specified: Secondary | ICD-10-CM | POA: Insufficient documentation

## 2011-06-10 DIAGNOSIS — I10 Essential (primary) hypertension: Secondary | ICD-10-CM | POA: Insufficient documentation

## 2011-06-10 HISTORY — DX: Essential (primary) hypertension: I10

## 2011-06-10 LAB — BASIC METABOLIC PANEL
CO2: 25 mEq/L (ref 19–32)
Calcium: 10.3 mg/dL (ref 8.4–10.5)
Creatinine, Ser: 1.1 mg/dL (ref 0.50–1.35)
Glucose, Bld: 110 mg/dL — ABNORMAL HIGH (ref 70–99)

## 2011-06-10 LAB — URINALYSIS, ROUTINE W REFLEX MICROSCOPIC
Glucose, UA: NEGATIVE mg/dL
Ketones, ur: NEGATIVE mg/dL
Protein, ur: 100 mg/dL — AB

## 2011-06-10 LAB — CBC
MCH: 30.2 pg (ref 26.0–34.0)
MCHC: 32.8 g/dL (ref 30.0–36.0)
MCV: 92 fL (ref 78.0–100.0)
Platelets: 267 10*3/uL (ref 150–400)
RBC: 3.98 MIL/uL — ABNORMAL LOW (ref 4.22–5.81)

## 2011-06-10 LAB — URINE MICROSCOPIC-ADD ON

## 2011-06-10 MED ORDER — SULFAMETHOXAZOLE-TRIMETHOPRIM 800-160 MG PO TABS: 1.0000 | ORAL_TABLET | Freq: Two times a day (BID) | ORAL | Status: AC

## 2011-06-10 MED ORDER — SULFAMETHOXAZOLE-TMP DS 800-160 MG PO TABS
1.0000 | ORAL_TABLET | Freq: Once | ORAL | Status: AC
  Administered 2011-06-10: 1 via ORAL
  Filled 2011-06-10: qty 1

## 2011-06-10 NOTE — ED Notes (Signed)
Patient states he did not sleep well last night, was up and down multiple time urinating, states he has burning with urination.  States when he got up this am, he did not feel good, was dizzy.  Took his blood pressure and it was 175/80's, confirmed bp with clinic nurse.  States now he feels better.  Just concerned and his PCP is not open today.

## 2011-06-10 NOTE — ED Provider Notes (Signed)
History     CSN: [id number] Arrival date & time: 06/10/2011 12:26 PM  Chief Complaint  Patient presents with  . Hypertension    urinary burning and frequency, dizziness    (Consider location/radiation/quality/duration/timing/severity/associated sxs/prior treatment) HPI Comments: Patient complains of some mild dysuria that started last night. He states that it burned while he was urinating. He also has increased urgency and frequency. He has not had problems with this before. He denies any fevers, nausea, vomiting, changes in bowel habits. He also notes that he simply hasn't felt quite well as he did not sleep well last night. He states that he had some dizziness but when asked what this means he really means that he has a slight headache. He has no associated numbness or weakness. He also checked his blood pressure this morning as he does every morning and noticed that it was elevated in the 160s which is different than his normal readings which are 100-120. He was going to see his primary care physician but he was not in the office today so the nurse sent him here.  Patient is a 77 y.o. male presenting with hypertension. The history is provided by the patient. No language interpreter was used.  Hypertension This is a new problem. The current episode started 12 to 24 hours ago. The problem has been gradually improving. Associated symptoms include headaches. Pertinent negatives include no chest pain, no abdominal pain and no shortness of breath. The symptoms are aggravated by nothing. The symptoms are relieved by nothing. He has tried nothing for the symptoms. The treatment provided no relief.    Past Medical History  Diagnosis Date  . Bowel habit changes     inability to control bowels   . Hypertension     Past Surgical History  Procedure Date  . Small intestine surgery     removal of part of small intestine    No family history on file.  History  Substance Use Topics  . Smoking  status: Former Games developer  . Smokeless tobacco: Not on file  . Alcohol Use: No      Review of Systems  Constitutional: Negative.  Negative for fever and chills.  Eyes: Negative.  Negative for discharge and redness.  Respiratory: Negative.  Negative for cough and shortness of breath.   Cardiovascular: Negative.  Negative for chest pain.  Gastrointestinal: Negative.  Negative for nausea, vomiting and abdominal pain.  Genitourinary: Positive for dysuria, urgency and frequency. Negative for hematuria.  Musculoskeletal: Negative.  Negative for back pain.  Skin: Negative.  Negative for color change and rash.  Neurological: Positive for headaches. Negative for syncope.  Hematological: Negative.  Negative for adenopathy.  Psychiatric/Behavioral: Negative.  Negative for confusion.  All other systems reviewed and are negative.    Allergies  Morphine and related and Penicillins  Home Medications   Current Outpatient Rx  Name Route Sig Dispense Refill  . FINASTERIDE 5 MG PO TABS Oral Take 1 mg by mouth Daily.    Marland Kitchen TAMSULOSIN HCL 0.4 MG PO CAPS      . VERAPAMIL HCL 40 MG PO TABS Oral Take 240 mg by mouth daily.        BP 164/73  Pulse 92  Temp(Src) 97.4 F (36.3 C) (Oral)  Ht 6\' 3"  (1.905 m)  Wt 160 lb (72.576 kg)  BMI 20.00 kg/m2  SpO2 96%  Physical Exam  Constitutional: He is oriented to person, place, and time. He appears well-developed and well-nourished.  Non-toxic appearance. He  does not have a sickly appearance.  HENT:  Head: Normocephalic and atraumatic.  Eyes: Conjunctivae, EOM and lids are normal. Pupils are equal, round, and reactive to light.  Neck: Trachea normal, normal range of motion and full passive range of motion without pain. Neck supple.  Cardiovascular: Normal rate, regular rhythm and normal heart sounds.   Pulmonary/Chest: Effort normal and breath sounds normal. No respiratory distress. He has no wheezes. He has no rales. He exhibits no tenderness.    Abdominal: Soft. Normal appearance. He exhibits no distension. There is no tenderness. There is no rebound and no CVA tenderness.  Musculoskeletal: Normal range of motion.  Neurological: He is alert and oriented to person, place, and time. He has normal strength.  Skin: Skin is warm, dry and intact. No rash noted.  Psychiatric: He has a normal mood and affect. His behavior is normal. Judgment and thought content normal.    ED Course  Procedures (including critical care time)   Results for orders placed during the hospital encounter of 06/10/11  CBC      Component Value Range   WBC 11.3 (*) 4.0 - 10.5 (K/uL)   RBC 3.98 (*) 4.22 - 5.81 (MIL/uL)   Hemoglobin 12.0 (*) 13.0 - 17.0 (g/dL)   HCT 16.1 (*) 09.6 - 52.0 (%)   MCV 92.0  78.0 - 100.0 (fL)   MCH 30.2  26.0 - 34.0 (pg)   MCHC 32.8  30.0 - 36.0 (g/dL)   RDW 04.5  40.9 - 81.1 (%)   Platelets 267  150 - 400 (K/uL)  BASIC METABOLIC PANEL      Component Value Range   Sodium 134 (*) 135 - 145 (mEq/L)   Potassium 3.8  3.5 - 5.1 (mEq/L)   Chloride 97  96 - 112 (mEq/L)   CO2 25  19 - 32 (mEq/L)   Glucose, Bld 110 (*) 70 - 99 (mg/dL)   BUN 33 (*) 6 - 23 (mg/dL)   Creatinine, Ser 9.14  0.50 - 1.35 (mg/dL)   Calcium 78.2  8.4 - 10.5 (mg/dL)   GFR calc non Af Amer >60  >60 (mL/min)   GFR calc Af Amer >60  >60 (mL/min)  URINALYSIS, ROUTINE W REFLEX MICROSCOPIC      Component Value Range   Color, Urine YELLOW  YELLOW    Appearance CLOUDY (*) CLEAR    Specific Gravity, Urine 1.006  1.005 - 1.030    pH 7.0  5.0 - 8.0    Glucose, UA NEGATIVE  NEGATIVE (mg/dL)   Hgb urine dipstick LARGE (*) NEGATIVE    Bilirubin Urine NEGATIVE  NEGATIVE    Ketones, ur NEGATIVE  NEGATIVE (mg/dL)   Protein, ur 956 (*) NEGATIVE (mg/dL)   Urobilinogen, UA 0.2  0.0 - 1.0 (mg/dL)   Nitrite NEGATIVE  NEGATIVE    Leukocytes, UA LARGE (*) NEGATIVE   URINE MICROSCOPIC-ADD ON      Component Value Range   Squamous Epithelial / LPF RARE  RARE    WBC, UA TOO  NUMEROUS TO COUNT  <3 (WBC/hpf)   RBC / HPF 7-10  <3 (RBC/hpf)   Bacteria, UA MANY (*) RARE    No results found.     MDM  Patient with symptoms consistent with UTI. He does demonstrate bacteria and white cells in his urine that correlate with the symptoms and I'll begin treatment with Bactrim. Patient has no  significant systemic symptoms to require admission to the hospital at this point in time. Patient will  have followup with his primary care physician Dr. Clovis Riley. The patient's blood pressures been slightly elevated here this can again be rechecked as an outpatient since patient's previous blood pressures have been quite low up to yesterday. Patient is tolerating by mouth intake and should have no difficulty in maintaining treatment for his urinary tract infection as an outpatient.        Nat Christen, MD 06/10/11 1355

## 2011-06-10 NOTE — ED Notes (Signed)
Walked Pt. To Pharmacy for script fill.  Pt. Walks steady gait.  No distress noted by pt.

## 2011-10-28 ENCOUNTER — Ambulatory Visit (INDEPENDENT_AMBULATORY_CARE_PROVIDER_SITE_OTHER): Payer: Medicare Other | Admitting: Ophthalmology

## 2011-10-28 DIAGNOSIS — H01009 Unspecified blepharitis unspecified eye, unspecified eyelid: Secondary | ICD-10-CM

## 2011-10-28 DIAGNOSIS — H353290 Exudative age-related macular degeneration, unspecified eye, stage unspecified: Secondary | ICD-10-CM

## 2011-10-28 DIAGNOSIS — H2510 Age-related nuclear cataract, unspecified eye: Secondary | ICD-10-CM

## 2011-10-28 DIAGNOSIS — H3530 Unspecified macular degeneration: Secondary | ICD-10-CM

## 2011-10-28 DIAGNOSIS — H43819 Vitreous degeneration, unspecified eye: Secondary | ICD-10-CM

## 2011-10-29 ENCOUNTER — Encounter (INDEPENDENT_AMBULATORY_CARE_PROVIDER_SITE_OTHER): Payer: Medicare Other | Admitting: Ophthalmology

## 2011-10-29 DIAGNOSIS — H353290 Exudative age-related macular degeneration, unspecified eye, stage unspecified: Secondary | ICD-10-CM

## 2011-10-29 DIAGNOSIS — H3530 Unspecified macular degeneration: Secondary | ICD-10-CM

## 2011-11-02 ENCOUNTER — Encounter (INDEPENDENT_AMBULATORY_CARE_PROVIDER_SITE_OTHER): Payer: Medicare Other | Admitting: Ophthalmology

## 2011-11-02 DIAGNOSIS — H353290 Exudative age-related macular degeneration, unspecified eye, stage unspecified: Secondary | ICD-10-CM

## 2011-11-02 DIAGNOSIS — H3530 Unspecified macular degeneration: Secondary | ICD-10-CM

## 2011-11-02 DIAGNOSIS — H43819 Vitreous degeneration, unspecified eye: Secondary | ICD-10-CM

## 2011-11-26 ENCOUNTER — Encounter (INDEPENDENT_AMBULATORY_CARE_PROVIDER_SITE_OTHER): Payer: Medicare Other | Admitting: Ophthalmology

## 2011-11-26 DIAGNOSIS — H2510 Age-related nuclear cataract, unspecified eye: Secondary | ICD-10-CM

## 2011-11-26 DIAGNOSIS — H3530 Unspecified macular degeneration: Secondary | ICD-10-CM

## 2011-11-26 DIAGNOSIS — H43819 Vitreous degeneration, unspecified eye: Secondary | ICD-10-CM

## 2011-11-26 DIAGNOSIS — H353290 Exudative age-related macular degeneration, unspecified eye, stage unspecified: Secondary | ICD-10-CM

## 2011-12-25 ENCOUNTER — Encounter (INDEPENDENT_AMBULATORY_CARE_PROVIDER_SITE_OTHER): Payer: Medicare Other | Admitting: Ophthalmology

## 2011-12-25 DIAGNOSIS — H43819 Vitreous degeneration, unspecified eye: Secondary | ICD-10-CM

## 2011-12-25 DIAGNOSIS — H353290 Exudative age-related macular degeneration, unspecified eye, stage unspecified: Secondary | ICD-10-CM

## 2011-12-25 DIAGNOSIS — H3530 Unspecified macular degeneration: Secondary | ICD-10-CM

## 2012-01-22 ENCOUNTER — Encounter (INDEPENDENT_AMBULATORY_CARE_PROVIDER_SITE_OTHER): Payer: Medicare Other | Admitting: Ophthalmology

## 2012-01-22 DIAGNOSIS — H3530 Unspecified macular degeneration: Secondary | ICD-10-CM

## 2012-01-22 DIAGNOSIS — H2510 Age-related nuclear cataract, unspecified eye: Secondary | ICD-10-CM

## 2012-01-22 DIAGNOSIS — H43819 Vitreous degeneration, unspecified eye: Secondary | ICD-10-CM

## 2012-01-22 DIAGNOSIS — H353290 Exudative age-related macular degeneration, unspecified eye, stage unspecified: Secondary | ICD-10-CM

## 2012-01-25 ENCOUNTER — Encounter (HOSPITAL_BASED_OUTPATIENT_CLINIC_OR_DEPARTMENT_OTHER): Payer: Self-pay | Admitting: *Deleted

## 2012-01-25 ENCOUNTER — Emergency Department (HOSPITAL_BASED_OUTPATIENT_CLINIC_OR_DEPARTMENT_OTHER)
Admission: EM | Admit: 2012-01-25 | Discharge: 2012-01-25 | Disposition: A | Discharge status: Home / Self Care | Payer: Medicare Other | Attending: Emergency Medicine | Admitting: Emergency Medicine

## 2012-01-25 DIAGNOSIS — J329 Chronic sinusitis, unspecified: Secondary | ICD-10-CM | POA: Insufficient documentation

## 2012-01-25 DIAGNOSIS — I10 Essential (primary) hypertension: Secondary | ICD-10-CM | POA: Insufficient documentation

## 2012-01-25 DIAGNOSIS — J3489 Other specified disorders of nose and nasal sinuses: Secondary | ICD-10-CM | POA: Insufficient documentation

## 2012-01-25 DIAGNOSIS — R059 Cough, unspecified: Secondary | ICD-10-CM | POA: Insufficient documentation

## 2012-01-25 DIAGNOSIS — R040 Epistaxis: Secondary | ICD-10-CM | POA: Insufficient documentation

## 2012-01-25 DIAGNOSIS — M549 Dorsalgia, unspecified: Secondary | ICD-10-CM | POA: Insufficient documentation

## 2012-01-25 DIAGNOSIS — Z7982 Long term (current) use of aspirin: Secondary | ICD-10-CM | POA: Insufficient documentation

## 2012-01-25 DIAGNOSIS — Z79899 Other long term (current) drug therapy: Secondary | ICD-10-CM | POA: Insufficient documentation

## 2012-01-25 DIAGNOSIS — R51 Headache: Secondary | ICD-10-CM | POA: Insufficient documentation

## 2012-01-25 DIAGNOSIS — R05 Cough: Secondary | ICD-10-CM | POA: Insufficient documentation

## 2012-01-25 HISTORY — DX: Unspecified atherosclerosis: I70.90

## 2012-01-25 MED ORDER — CETIRIZINE HCL 10 MG PO TABS: 10.0000 mg | ORAL_TABLET | Freq: Every day | ORAL | Status: DC

## 2012-01-25 NOTE — ED Notes (Signed)
Patient states he has had sinus congestion for the last 2 months.  Hx of same all of his life.  S/O sinus headache and for the last 3 weeks has had intermittent nose bleed.  Using saline nasal spray with some relief.

## 2012-01-25 NOTE — ED Provider Notes (Signed)
History     CSN: [id number]  Arrival date & time 01/25/12  1005   First MD Initiated Contact with Patient 01/25/12 1106      Chief Complaint  Patient presents with  . URI    (Consider location/radiation/quality/duration/timing/severity/associated sxs/prior treatment) Patient is a 78 y.o. male presenting with URI. The history is provided by the patient.  URI The primary symptoms include headaches and cough. Primary symptoms do not include fever, ear pain, sore throat, wheezing, abdominal pain, nausea, vomiting or rash. The current episode started more than 1 week ago. This is a chronic problem. The problem has been gradually worsening.  Symptoms associated with the illness include plugged ear sensation, sinus pressure and congestion.   Symptoms are more related to sinus pain which is intermittent at times very sharp associated with the headache but it again is also intermittent but having problems for the last 2 months occasional nosebleed but not bleeding now relieved mostly by saline nasal spray also patient was using Nasonex that was provided by his primary care doctor Dr. Clovis Riley. Patient has been having trouble with this for years predominantly during allergy times of the year. Past Medical History  Diagnosis Date  . Bowel habit changes     inability to control bowels   . Hypertension   . Vascular degeneration     Past Surgical History  Procedure Date  . Small intestine surgery     removal of part of small intestine    No family history on file.  History  Substance Use Topics  . Smoking status: Former Games developer  . Smokeless tobacco: Not on file  . Alcohol Use: No      Review of Systems  Constitutional: Negative for fever.  HENT: Positive for nosebleeds, congestion and sinus pressure. Negative for ear pain, sore throat and neck pain.   Respiratory: Positive for cough. Negative for shortness of breath and wheezing.   Cardiovascular: Negative for chest pain.    Gastrointestinal: Negative for nausea, vomiting and abdominal pain.  Genitourinary: Negative for dysuria.  Musculoskeletal: Positive for back pain.  Skin: Negative for rash.  Neurological: Positive for headaches.  Hematological: Does not bruise/bleed easily.    Allergies  Morphine and related and Penicillins  Home Medications   Current Outpatient Rx  Name Route Sig Dispense Refill  . ASPIRIN 81 MG PO TABS Oral Take 81 mg by mouth daily.    Marland Kitchen CETIRIZINE HCL 10 MG PO TABS Oral Take 1 tablet (10 mg total) by mouth daily. 7 tablet 1  . FINASTERIDE 5 MG PO TABS Oral Take 1 mg by mouth Daily.    Marland Kitchen TAMSULOSIN HCL 0.4 MG PO CAPS      . VERAPAMIL HCL 40 MG PO TABS Oral Take 240 mg by mouth daily.        BP 162/74  Pulse 81  Temp(Src) 96.5 F (35.8 C) (Oral)  Resp 18  Ht 6\' 3"  (1.905 m)  Wt 160 lb (72.576 kg)  BMI 20.00 kg/m2  SpO2 100%  Physical Exam  Nursing note and vitals reviewed. Constitutional: He is oriented to person, place, and time. He appears well-developed and well-nourished. No distress.  HENT:  Head: Normocephalic and atraumatic.  Right Ear: External ear normal.  Left Ear: External ear normal.  Mouth/Throat: Oropharynx is clear and moist.       Left ear with a little bit of cerumen it is not impacted. Able to see part of the tympanic membrane.  Eyes: Conjunctivae and EOM  are normal. Pupils are equal, round, and reactive to light.  Neck: Normal range of motion. Neck supple.  Pulmonary/Chest: Effort normal and breath sounds normal. No respiratory distress. He has no wheezes. He has no rales.  Abdominal: Soft. Bowel sounds are normal. There is no tenderness.  Musculoskeletal: Normal range of motion. He exhibits no edema.  Neurological: He is alert and oriented to person, place, and time. No cranial nerve deficit. He exhibits normal muscle tone. Coordination normal.  Skin: Skin is warm. No rash noted. No erythema.    ED Course  Procedures (including critical care  time)  Labs Reviewed - No data to display No results found.   1. Chronic sinusitis       MDM   Patient with history of chronic sinus problems already using Nasonex spray provided by his primary care doctor most of his problems are during the allergy season. Will give a trial of Zyrtec although it may be too strong for his age patient knows to stop it we'll try one tablet. Patient prefers to followup with primary care doctor for referral to ear nose and throat which is okay I asked. Ears are normal sinuses are nontender no fever no evidence of acute sinusitis. Patient states his sinus problems are usually during allergy season.        Shelda Jakes, MD 01/25/12 1149

## 2012-01-25 NOTE — Discharge Instructions (Signed)
Sinusitis Sinusitis an infection of the air pockets (sinuses) in your face. This can cause puffiness (swelling). It can also cause drainage from your sinuses.  HOME CARE   Only take medicine as told by your doctor.   Drink enough fluids to keep your pee (urine) clear or pale yellow.   Apply moist heat or ice packs for pain relief.   Use salt (saline) nose sprays. The spray will wet the thick fluid in the nose. This can help the sinuses drain.  GET HELP RIGHT AWAY IF:   You have a fever.   Your baby is older than 3 months with a rectal temperature of 102 F (38.9 C) or higher.   Your baby is 71 months old or younger with a rectal temperature of 100.4 F (38 C) or higher.   The pain gets worse.   You get a very bad headache.   You keep throwing up (vomiting).   Your face gets puffy.  MAKE SURE YOU:   Understand these instructions.   Will watch your condition.   Will get help right away if you are not doing well or get worse.  Document Released: 02/17/2008 Document Revised: 08/20/2011 Document Reviewed: 02/17/2008 [facility] Patient Information 2012 [place], [place].  Followup with your primary care Dr. make an appointment in the next few days. Would recommend ear nose and throat referral your preferences to have him make the referral. Trial of Zyrtec it may be too strong for you if so stopped it otherwise it may help with a chronic sinus problem.

## 2012-02-24 ENCOUNTER — Encounter (INDEPENDENT_AMBULATORY_CARE_PROVIDER_SITE_OTHER): Payer: Medicare Other | Admitting: Ophthalmology

## 2012-02-24 DIAGNOSIS — H2510 Age-related nuclear cataract, unspecified eye: Secondary | ICD-10-CM

## 2012-02-24 DIAGNOSIS — H3530 Unspecified macular degeneration: Secondary | ICD-10-CM

## 2012-02-24 DIAGNOSIS — H43819 Vitreous degeneration, unspecified eye: Secondary | ICD-10-CM

## 2012-02-24 DIAGNOSIS — I10 Essential (primary) hypertension: Secondary | ICD-10-CM

## 2012-02-24 DIAGNOSIS — H35039 Hypertensive retinopathy, unspecified eye: Secondary | ICD-10-CM

## 2012-02-24 DIAGNOSIS — H353290 Exudative age-related macular degeneration, unspecified eye, stage unspecified: Secondary | ICD-10-CM

## 2012-04-13 ENCOUNTER — Encounter (INDEPENDENT_AMBULATORY_CARE_PROVIDER_SITE_OTHER): Payer: Medicare Other | Admitting: Ophthalmology

## 2012-04-13 DIAGNOSIS — I10 Essential (primary) hypertension: Secondary | ICD-10-CM

## 2012-04-13 DIAGNOSIS — H43819 Vitreous degeneration, unspecified eye: Secondary | ICD-10-CM

## 2012-04-13 DIAGNOSIS — H2510 Age-related nuclear cataract, unspecified eye: Secondary | ICD-10-CM

## 2012-04-13 DIAGNOSIS — H35039 Hypertensive retinopathy, unspecified eye: Secondary | ICD-10-CM

## 2012-04-13 DIAGNOSIS — H3530 Unspecified macular degeneration: Secondary | ICD-10-CM

## 2012-04-13 DIAGNOSIS — H353290 Exudative age-related macular degeneration, unspecified eye, stage unspecified: Secondary | ICD-10-CM

## 2012-06-08 ENCOUNTER — Encounter (INDEPENDENT_AMBULATORY_CARE_PROVIDER_SITE_OTHER): Payer: Medicare Other | Admitting: Ophthalmology

## 2012-06-08 DIAGNOSIS — H35039 Hypertensive retinopathy, unspecified eye: Secondary | ICD-10-CM

## 2012-06-08 DIAGNOSIS — H3530 Unspecified macular degeneration: Secondary | ICD-10-CM

## 2012-06-08 DIAGNOSIS — H43819 Vitreous degeneration, unspecified eye: Secondary | ICD-10-CM

## 2012-06-08 DIAGNOSIS — I10 Essential (primary) hypertension: Secondary | ICD-10-CM

## 2012-06-08 DIAGNOSIS — H353290 Exudative age-related macular degeneration, unspecified eye, stage unspecified: Secondary | ICD-10-CM

## 2012-06-08 DIAGNOSIS — H2510 Age-related nuclear cataract, unspecified eye: Secondary | ICD-10-CM

## 2012-08-03 ENCOUNTER — Encounter (INDEPENDENT_AMBULATORY_CARE_PROVIDER_SITE_OTHER): Payer: Medicare Other | Admitting: Ophthalmology

## 2012-08-03 DIAGNOSIS — H2510 Age-related nuclear cataract, unspecified eye: Secondary | ICD-10-CM

## 2012-08-03 DIAGNOSIS — H35039 Hypertensive retinopathy, unspecified eye: Secondary | ICD-10-CM

## 2012-08-03 DIAGNOSIS — I10 Essential (primary) hypertension: Secondary | ICD-10-CM

## 2012-08-03 DIAGNOSIS — H3530 Unspecified macular degeneration: Secondary | ICD-10-CM

## 2012-08-03 DIAGNOSIS — H43819 Vitreous degeneration, unspecified eye: Secondary | ICD-10-CM

## 2012-08-03 DIAGNOSIS — H353290 Exudative age-related macular degeneration, unspecified eye, stage unspecified: Secondary | ICD-10-CM

## 2012-09-30 ENCOUNTER — Encounter (HOSPITAL_BASED_OUTPATIENT_CLINIC_OR_DEPARTMENT_OTHER): Payer: Self-pay | Admitting: Emergency Medicine

## 2012-09-30 ENCOUNTER — Emergency Department (HOSPITAL_BASED_OUTPATIENT_CLINIC_OR_DEPARTMENT_OTHER)
Admission: EM | Admit: 2012-09-30 | Discharge: 2012-09-30 | Disposition: A | Discharge status: Home / Self Care | Payer: Medicare Other | Attending: Emergency Medicine | Admitting: Emergency Medicine

## 2012-09-30 DIAGNOSIS — Z87891 Personal history of nicotine dependence: Secondary | ICD-10-CM | POA: Insufficient documentation

## 2012-09-30 DIAGNOSIS — Z79899 Other long term (current) drug therapy: Secondary | ICD-10-CM | POA: Insufficient documentation

## 2012-09-30 DIAGNOSIS — K5900 Constipation, unspecified: Secondary | ICD-10-CM

## 2012-09-30 DIAGNOSIS — Z7982 Long term (current) use of aspirin: Secondary | ICD-10-CM | POA: Insufficient documentation

## 2012-09-30 DIAGNOSIS — I10 Essential (primary) hypertension: Secondary | ICD-10-CM | POA: Insufficient documentation

## 2012-09-30 DIAGNOSIS — Z8679 Personal history of other diseases of the circulatory system: Secondary | ICD-10-CM | POA: Insufficient documentation

## 2012-09-30 MED ORDER — POLYETHYLENE GLYCOL 3350 17 GM/SCOOP PO POWD: 17.0000 g | Freq: Every day | ORAL | Status: DC

## 2012-09-30 NOTE — ED Notes (Signed)
Pt has been constipated for 10 days.  Yesterday took a laxative. No success. Does continue to pass gas.

## 2012-09-30 NOTE — ED Notes (Signed)
Pt states he had intestinal surgery about 2 years ago and wonders if this constipation may be related.

## 2012-09-30 NOTE — ED Notes (Signed)
MD at bedside. 

## 2012-09-30 NOTE — ED Provider Notes (Signed)
History     CSN: [id number]  Arrival date & time 09/30/12  1035   First MD Initiated Contact with Patient 09/30/12 1401      Chief Complaint  Patient presents with  . Constipation    (Consider location/radiation/quality/duration/timing/severity/associated sxs/prior treatment) HPI Pt states he has had chronic diarrhea but usually has BM after 3-4 days. It has now been 10 days since last BM. He is having no pain or distention. No N/V, fever. He is passing gas regularly. He has only intermittently used at home laxatives. Pt denies taking narcotic pain meds.  Past Medical History  Diagnosis Date  . Bowel habit changes     inability to control bowels   . Hypertension   . Vascular degeneration     Past Surgical History  Procedure Date  . Small intestine surgery     removal of part of small intestine    History reviewed. No pertinent family history.  History  Substance Use Topics  . Smoking status: Former Games developer  . Smokeless tobacco: Not on file  . Alcohol Use: No      Review of Systems  Constitutional: Negative for fever and chills.  Respiratory: Negative for shortness of breath.   Cardiovascular: Negative for chest pain.  Gastrointestinal: Positive for constipation. Negative for nausea, vomiting, abdominal pain, diarrhea, blood in stool, abdominal distention and anal bleeding.  Genitourinary: Negative for dysuria and flank pain.  Musculoskeletal: Negative for myalgias and back pain.  Neurological: Negative for dizziness, weakness, light-headedness, numbness and headaches.    Allergies  Morphine and related and Penicillins  Home Medications   Current Outpatient Rx  Name  Route  Sig  Dispense  Refill  . ASPIRIN 81 MG PO TABS   Oral   Take 81 mg by mouth daily.         Marland Kitchen CETIRIZINE HCL 10 MG PO TABS   Oral   Take 1 tablet (10 mg total) by mouth daily.   7 tablet   1   . FINASTERIDE 5 MG PO TABS   Oral   Take 1 mg by mouth Daily.         Marland Kitchen  POLYETHYLENE GLYCOL 3350 PO POWD   Oral   Take 17 g by mouth daily.   255 g   0   . TAMSULOSIN HCL 0.4 MG PO CAPS               . VERAPAMIL HCL 40 MG PO TABS   Oral   Take 240 mg by mouth daily.             BP 146/85  Pulse 84  Temp 97.5 F (36.4 C) (Oral)  Resp 12  Ht 6\' 3"  (1.905 m)  Wt 151 lb (68.493 kg)  BMI 18.87 kg/m2  SpO2 98%  Physical Exam  Nursing note and vitals reviewed. Constitutional: He is oriented to person, place, and time. He appears well-developed and well-nourished. No distress.       Very well appearing. Walking around room.   HENT:  Head: Normocephalic and atraumatic.  Mouth/Throat: Oropharynx is clear and moist.  Eyes: EOM are normal. Pupils are equal, round, and reactive to light.  Neck: Normal range of motion. Neck supple.  Cardiovascular: Normal rate and regular rhythm.   Pulmonary/Chest: Effort normal and breath sounds normal. No respiratory distress. He has no wheezes. He has no rales.  Abdominal: Soft. Bowel sounds are normal. He exhibits no distension and no mass. There is no tenderness.  There is no rebound and no guarding.  Musculoskeletal: Normal range of motion. He exhibits no edema and no tenderness.  Neurological: He is alert and oriented to person, place, and time.  Skin: Skin is warm and dry. No rash noted. No erythema.  Psychiatric: He has a normal mood and affect. His behavior is normal.    ED Course  Procedures (including critical care time)  Labs Reviewed - No data to display No results found.   1. Constipation       MDM  Pt reassured and will start on Miralax. Pt encouraged to return for abd pain, fever, chills, inability to pass gas or any concerns        Loren Racer, MD 09/30/12 1447

## 2012-10-05 ENCOUNTER — Encounter (INDEPENDENT_AMBULATORY_CARE_PROVIDER_SITE_OTHER): Payer: Medicare Other | Admitting: Ophthalmology

## 2012-10-06 ENCOUNTER — Encounter (INDEPENDENT_AMBULATORY_CARE_PROVIDER_SITE_OTHER): Payer: Medicare Other | Admitting: Ophthalmology

## 2012-10-12 ENCOUNTER — Encounter (INDEPENDENT_AMBULATORY_CARE_PROVIDER_SITE_OTHER): Payer: Medicare Other | Admitting: Ophthalmology

## 2012-10-19 ENCOUNTER — Encounter (INDEPENDENT_AMBULATORY_CARE_PROVIDER_SITE_OTHER): Payer: Medicare Other | Admitting: Ophthalmology

## 2012-10-19 DIAGNOSIS — H43819 Vitreous degeneration, unspecified eye: Secondary | ICD-10-CM

## 2012-10-19 DIAGNOSIS — H2510 Age-related nuclear cataract, unspecified eye: Secondary | ICD-10-CM

## 2012-10-19 DIAGNOSIS — H3530 Unspecified macular degeneration: Secondary | ICD-10-CM

## 2012-10-19 DIAGNOSIS — I10 Essential (primary) hypertension: Secondary | ICD-10-CM

## 2012-10-19 DIAGNOSIS — H353290 Exudative age-related macular degeneration, unspecified eye, stage unspecified: Secondary | ICD-10-CM

## 2012-10-19 DIAGNOSIS — H35039 Hypertensive retinopathy, unspecified eye: Secondary | ICD-10-CM

## 2012-11-19 ENCOUNTER — Emergency Department (HOSPITAL_COMMUNITY): Payer: Medicare Other

## 2012-11-19 ENCOUNTER — Encounter (HOSPITAL_COMMUNITY): Payer: Self-pay

## 2012-11-19 ENCOUNTER — Inpatient Hospital Stay (HOSPITAL_COMMUNITY)
Admission: EM | Admit: 2012-11-19 | Discharge: 2012-11-21 | DRG: 312 | Disposition: A | Discharge status: Home / Self Care | Payer: Medicare Other | Attending: Internal Medicine | Admitting: Internal Medicine

## 2012-11-19 DIAGNOSIS — Z9049 Acquired absence of other specified parts of digestive tract: Secondary | ICD-10-CM

## 2012-11-19 DIAGNOSIS — D649 Anemia, unspecified: Secondary | ICD-10-CM | POA: Diagnosis present

## 2012-11-19 DIAGNOSIS — R195 Other fecal abnormalities: Secondary | ICD-10-CM | POA: Diagnosis present

## 2012-11-19 DIAGNOSIS — Z79899 Other long term (current) drug therapy: Secondary | ICD-10-CM

## 2012-11-19 DIAGNOSIS — M48061 Spinal stenosis, lumbar region without neurogenic claudication: Secondary | ICD-10-CM | POA: Diagnosis present

## 2012-11-19 DIAGNOSIS — I129 Hypertensive chronic kidney disease with stage 1 through stage 4 chronic kidney disease, or unspecified chronic kidney disease: Secondary | ICD-10-CM | POA: Diagnosis present

## 2012-11-19 DIAGNOSIS — N189 Chronic kidney disease, unspecified: Secondary | ICD-10-CM | POA: Diagnosis present

## 2012-11-19 DIAGNOSIS — D509 Iron deficiency anemia, unspecified: Secondary | ICD-10-CM | POA: Diagnosis present

## 2012-11-19 DIAGNOSIS — N179 Acute kidney failure, unspecified: Secondary | ICD-10-CM

## 2012-11-19 DIAGNOSIS — N400 Enlarged prostate without lower urinary tract symptoms: Secondary | ICD-10-CM | POA: Diagnosis present

## 2012-11-19 DIAGNOSIS — I7090 Unspecified atherosclerosis: Secondary | ICD-10-CM | POA: Diagnosis present

## 2012-11-19 DIAGNOSIS — R55 Syncope and collapse: Principal | ICD-10-CM | POA: Diagnosis present

## 2012-11-19 HISTORY — DX: Acute (reversible) ischemia of intestine, part and extent unspecified: K55.059

## 2012-11-19 HISTORY — DX: Spinal stenosis, lumbar region without neurogenic claudication: M48.061

## 2012-11-19 HISTORY — DX: Benign carcinoid tumor of the small intestine, unspecified portion: D3A.019

## 2012-11-19 LAB — CBC WITH DIFFERENTIAL/PLATELET
Basophils Absolute: 0.1 10*3/uL (ref 0.0–0.1)
Basophils Absolute: 0 10*3/uL (ref 0.0–0.1)
Basophils Relative: 1 % (ref 0–1)
Basophils Relative: 0 % (ref 0–1)
Eosinophils Absolute: 0.3 10*3/uL (ref 0.0–0.7)
Eosinophils Relative: 4 % (ref 0–5)
Eosinophils Relative: 3 % (ref 0–5)
MCH: 29.5 pg (ref 26.0–34.0)
MCH: 29.4 pg (ref 26.0–34.0)
MCHC: 31.7 g/dL (ref 30.0–36.0)
MCV: 93 fL (ref 78.0–100.0)
Monocytes Absolute: 0.8 10*3/uL (ref 0.1–1.0)
Monocytes Relative: 8 % (ref 3–12)
Neutro Abs: 6.5 10*3/uL (ref 1.7–7.7)
Neutrophils Relative %: 63 % (ref 43–77)
Platelets: 323 10*3/uL (ref 150–400)
Platelets: 351 10*3/uL (ref 150–400)
RBC: 3.61 MIL/uL — ABNORMAL LOW (ref 4.22–5.81)
RDW: 13.6 % (ref 11.5–15.5)
WBC: 9.3 10*3/uL (ref 4.0–10.5)

## 2012-11-19 LAB — COMPREHENSIVE METABOLIC PANEL
ALT: 8 U/L (ref 0–53)
AST: 16 U/L (ref 0–37)
Albumin: 3.7 g/dL (ref 3.5–5.2)
Alkaline Phosphatase: 83 U/L (ref 39–117)
BUN: 20 mg/dL (ref 6–23)
Calcium: 9.5 mg/dL (ref 8.4–10.5)
Calcium: 9.5 mg/dL (ref 8.4–10.5)
Creatinine, Ser: 1.06 mg/dL (ref 0.50–1.35)
GFR calc Af Amer: 63 mL/min — ABNORMAL LOW (ref >90)
GFR calc Af Amer: 67 mL/min — ABNORMAL LOW (ref >90)
Glucose, Bld: 108 mg/dL — ABNORMAL HIGH (ref 70–99)
Glucose, Bld: 101 mg/dL — ABNORMAL HIGH (ref 70–99)
Potassium: 4.3 mEq/L (ref 3.5–5.1)
Sodium: 135 mEq/L (ref 135–145)
Total Protein: 7.7 g/dL (ref 6.0–8.3)
Total Protein: 7.7 g/dL (ref 6.0–8.3)

## 2012-11-19 LAB — MAGNESIUM: Magnesium: 2 mg/dL (ref 1.5–2.5)

## 2012-11-19 LAB — PROTIME-INR: INR: 1.1 (ref 0.00–1.49)

## 2012-11-19 LAB — PHOSPHORUS: Phosphorus: 2.6 mg/dL (ref 2.3–4.6)

## 2012-11-19 LAB — TROPONIN I: Troponin I: <0.3 ng/mL (ref <0.30)

## 2012-11-19 MED ORDER — ONDANSETRON HCL 4 MG PO TABS: 4.0000 mg | ORAL_TABLET | Freq: Four times a day (QID) | ORAL | Status: DC | PRN

## 2012-11-19 MED ORDER — ONDANSETRON HCL 4 MG/2ML IJ SOLN: 4.0000 mg | Freq: Four times a day (QID) | INTRAMUSCULAR | Status: DC | PRN

## 2012-11-19 MED ORDER — SODIUM CHLORIDE 0.9 % IJ SOLN
3.0000 mL | Freq: Two times a day (BID) | INTRAMUSCULAR | Status: DC
  Administered 2012-11-21: 3 mL via INTRAVENOUS

## 2012-11-19 MED ORDER — ACETAMINOPHEN 325 MG PO TABS
650.0000 mg | ORAL_TABLET | Freq: Four times a day (QID) | ORAL | Status: DC | PRN
  Administered 2012-11-20: 650 mg via ORAL
  Filled 2012-11-19: qty 2

## 2012-11-19 MED ORDER — SODIUM CHLORIDE 0.9 % IV SOLN
INTRAVENOUS | Status: DC
  Administered 2012-11-19: 17:00:00 via INTRAVENOUS

## 2012-11-19 MED ORDER — RISAQUAD PO CAPS
1.0000 | ORAL_CAPSULE | Freq: Every day | ORAL | Status: DC
  Administered 2012-11-19 – 2012-11-21 (×3): 1 via ORAL
  Filled 2012-11-19 (×3): qty 1

## 2012-11-19 MED ORDER — HYDROCODONE-ACETAMINOPHEN 5-325 MG PO TABS: 1.0000 | ORAL_TABLET | ORAL | Status: DC | PRN

## 2012-11-19 MED ORDER — FINASTERIDE 5 MG PO TABS
5.0000 mg | ORAL_TABLET | Freq: Every day | ORAL | Status: DC
  Administered 2012-11-20 – 2012-11-21 (×2): 5 mg via ORAL
  Filled 2012-11-19 (×2): qty 1

## 2012-11-19 MED ORDER — TAMSULOSIN HCL 0.4 MG PO CAPS
0.4000 mg | ORAL_CAPSULE | Freq: Every day | ORAL | Status: DC
  Administered 2012-11-19 – 2012-11-21 (×3): 0.4 mg via ORAL
  Filled 2012-11-19 (×3): qty 1

## 2012-11-19 MED ORDER — ACETAMINOPHEN 650 MG RE SUPP: 650.0000 mg | Freq: Four times a day (QID) | RECTAL | Status: DC | PRN

## 2012-11-19 NOTE — ED Notes (Signed)
MD at bedside. 

## 2012-11-19 NOTE — ED Notes (Signed)
Bed:WA20<BR> Expected date:<BR> Expected time:<BR> Means of arrival:<BR> Comments:<BR>

## 2012-11-19 NOTE — ED Provider Notes (Signed)
History     CSN: [id number]  Arrival date & time 11/19/12  1014   First MD Initiated Contact with Patient 11/19/12 1057      Chief Complaint  Patient presents with  . Fall  . Dizziness    (Consider location/radiation/quality/duration/timing/severity/associated sxs/prior treatment) HPI Comments: Patient by EMS after a syncopal episode.  He woke up this morning and passed out while getting up out of bed.  He hit his head on the nightstand and was unconscious for several seconds.  He now has a headache.  He reports similar dizzy episodes over the past 2-3 months that have been occurring intermittently.  These episodes are described as a spinning sensation and occur without warning, but he has never passed out before.  No bloody or dark stools.  Patient is a 79 y.o. male presenting with fall. The history is provided by the patient.  Fall The accident occurred 1 to 2 hours ago. The fall occurred while walking. He fell from a height of 1 to 2 ft. He landed on carpet. There was no blood loss. The point of impact was the head. The pain is present in the head. The pain is mild. He was ambulatory at the scene. There was no entrapment after the fall. Associated symptoms include loss of consciousness. Pertinent negatives include no visual change and no fever.    Past Medical History  Diagnosis Date  . Bowel habit changes     inability to control bowels   . Hypertension   . Vascular degeneration     Past Surgical History  Procedure Laterality Date  . Small intestine surgery      removal of part of small intestine    No family history on file.  History  Substance Use Topics  . Smoking status: Former Games developer  . Smokeless tobacco: Not on file  . Alcohol Use: No      Review of Systems  Constitutional: Negative for fever.  Neurological: Positive for loss of consciousness.  All other systems reviewed and are negative.    Allergies  Morphine and related and Penicillins  Home  Medications   Current Outpatient Rx  Name  Route  Sig  Dispense  Refill  . aspirin 81 MG tablet   Oral   Take 81 mg by mouth daily.         . cetirizine (ZYRTEC) 10 MG tablet   Oral   Take 1 tablet (10 mg total) by mouth daily.   7 tablet   1   . finasteride (PROSCAR) 5 MG tablet   Oral   Take 1 mg by mouth Daily.         . polyethylene glycol powder (GLYCOLAX/MIRALAX) powder   Oral   Take 17 g by mouth daily.   255 g   0   . Tamsulosin HCl (FLOMAX) 0.4 MG CAPS               . verapamil (CALAN) 40 MG tablet   Oral   Take 240 mg by mouth daily.            BP 173/75  Pulse 86  Temp(Src) 97.8 F (36.6 C) (Oral)  Resp 17  SpO2 100%  Physical Exam  Nursing note and vitals reviewed. Constitutional: He is oriented to person, place, and time. He appears well-developed and well-nourished. No distress.  HENT:  Head: Normocephalic and atraumatic.  Mouth/Throat: Oropharynx is clear and moist.  Eyes: EOM are normal. Pupils are equal, round, and  reactive to light.  Neck: Normal range of motion. Neck supple.  Cardiovascular: Normal rate and regular rhythm.   No murmur heard. Pulmonary/Chest: Effort normal and breath sounds normal. No respiratory distress. He has no wheezes.  Abdominal: Soft. Bowel sounds are normal. He exhibits no distension. There is no tenderness.  Musculoskeletal: Normal range of motion. He exhibits no edema.  Lymphadenopathy:    He has no cervical adenopathy.  Neurological: He is alert and oriented to person, place, and time. No cranial nerve deficit. He exhibits normal muscle tone. Coordination normal.  Skin: Skin is warm and dry. He is not diaphoretic.    ED Course  Procedures (including critical care time)  Labs Reviewed - No data to display No results found.   No diagnosis found.   Date: 11/19/2012  Rate: 76  Rhythm: normal sinus rhythm  QRS Axis: normal  Intervals: normal  ST/T Wave abnormalities: normal  Conduction  Disutrbances:none  Narrative Interpretation:   Old EKG Reviewed: unchanged    MDM  The patient presents here after a syncopal episode.  The workup reveals heme positive stools and a Hb of 9.7 (down from 12).  Spoke with GI (Dr. Delle Reining) who suggests admission to [facility], will consult on the patient.        Geoffery Lyons, MD 11/19/12 1407

## 2012-11-19 NOTE — ED Notes (Signed)
Pt sts he has had an intermittent headache and dizziness x 2 yrs.  Sts had dizziness and LOC this am and fell.  Sts he hit the back of his head on this dresser.  Skin tear noted on L elbow and abrasion on R shoulder.  Sts current headache.  Pain score 7/10.

## 2012-11-19 NOTE — ED Provider Notes (Deleted)
History     CSN: [id number]  Arrival date & time 11/19/12  1014   First MD Initiated Contact with Patient 11/19/12 1057      Chief Complaint  Patient presents with  . Fall  . Dizziness    (Consider location/radiation/quality/duration/timing/severity/associated sxs/prior treatment) HPI  Past Medical History  Diagnosis Date  . Bowel habit changes     inability to control bowels   . Hypertension   . Vascular degeneration     Past Surgical History  Procedure Laterality Date  . Small intestine surgery      removal of part of small intestine    No family history on file.  History  Substance Use Topics  . Smoking status: Former Games developer  . Smokeless tobacco: Not on file  . Alcohol Use: No      Review of Systems  Allergies  Morphine and related and Penicillins  Home Medications   Current Outpatient Rx  Name  Route  Sig  Dispense  Refill  . acetaminophen (TYLENOL) 500 MG tablet   Oral   Take 500 mg by mouth every 6 (six) hours as needed for pain.         Marland Kitchen acidophilus (RISAQUAD) CAPS   Oral   Take 1 capsule by mouth daily.         Marland Kitchen aspirin 81 MG tablet   Oral   Take 81 mg by mouth daily.         . finasteride (PROSCAR) 5 MG tablet   Oral   Take 5 mg by mouth Daily.          . Tamsulosin HCl (FLOMAX) 0.4 MG CAPS                 BP 172/83  Pulse 65  Temp(Src) 97.9 F (36.6 C) (Oral)  Resp 18  SpO2 100%  Physical Exam  ED Course  Procedures (including critical care time)  Labs Reviewed  CBC WITH DIFFERENTIAL - Abnormal; Notable for the following:    RBC 3.29 (*)    Hemoglobin 9.7 (*)    HCT 30.6 (*)    Monocytes Relative 13 (*)    All other components within normal limits  COMPREHENSIVE METABOLIC PANEL - Abnormal; Notable for the following:    Glucose, Bld 108 (*)    BUN 25 (*)    GFR calc non Af Amer 54 (*)    GFR calc Af Amer 63 (*)    All other components within normal limits  OCCULT BLOOD, POC DEVICE - Abnormal;  Notable for the following:    Fecal Occult Bld POSITIVE (*)    All other components within normal limits  TROPONIN I   Ct Head Wo Contrast  11/19/2012  *RADIOLOGY REPORT*  Clinical Data: Syncope.  Intermittent headache and dizziness.  CT HEAD WITHOUT CONTRAST  Technique:  Contiguous axial images were obtained from the base of the skull through the vertex without contrast.  Comparison: MRI of the brain 01/03/2004.  Findings: Moderate cerebral and cerebellar atrophy.  Patchy and confluent areas of decreased attenuation throughout the deep and periventricular white matter of the cerebral hemispheres bilaterally, most compatible with chronic microvascular ischemic disease. No acute intracranial abnormalities.  Specifically, no evidence of acute intracranial hemorrhage, no definite findings of acute/subacute cerebral ischemia, no mass, mass effect, hydrocephalus or abnormal intra or extra-axial fluid collections. The mastoids are generally well pneumatized bilaterally, although there is a trace amount of fluid in the mastoids posteriorly bilaterally.  Mild mucosal  thickening scattered throughout the ethmoid sinuses bilaterally, with extensive mucoperiosteal thickening in the left maxillary sinus which is nearly completely opacified with some high attenuation secretions (likely inspissated secretions).  IMPRESSION: 1.  No acute intracranial abnormalities. 2.  Moderate cerebral and cerebellar atrophy with extensive chronic microvascular ischemic changes in the deep and periventricular white matter of the cerebral hemispheres bilaterally. 3.  Paranasal sinus disease, as above, including evidence of chronic sinusitis in the left maxillary sinus.   Original Report Authenticated By: Trudie Reed, M.D.      No diagnosis found.   Date: 11/19/2012  Rate: 76  Rhythm: normal sinus rhythm  QRS Axis: normal  Intervals: normal  ST/T Wave abnormalities: normal  Conduction Disutrbances:none  Narrative  Interpretation:   Old EKG Reviewed: none available    MDM  The patient presents after a syncopal episode this am.  The labs reveal a Hb of 9.7 and is down from 12.  The stools are heme positive.  I have spoken with GI who will consult but wants patient admitted to medicine.          Geoffery Lyons, MD 11/19/12 1400

## 2012-11-19 NOTE — H&P (Signed)
[facility] History and Physical  BERNABE DORCE YNW:[id number] DOB: July 24, 1916 DOA: 11/19/2012  Referring physician: ER physician PCP: Benita Stabile, MD   Chief Complaint: syncope  HPI:  79 year old male with no significant past medical history except for BPH presented to [facility] ED status post fall at home. Patient reported he has lost consciousness he thinks for few seconds. He recalls he fell as he has attempted to get up from the bed. No reports of prodromal symptoms prior to fall including shortness of breath, palpitations or chest pain. NO abdominal pain, no nausea or vomiting. No reports of constipation or diarrhea. No reports of fever or chills or cough. In ED, evaluation included CT head which did not reveal acute intracranial findings. His BMET was unremarkable. CBC revealed hemoglobin of 9.7 and heme positive stool. GI was consulted by an ED physician.  Assessment and Plan:  Principal Problem:   *Syncope  Likely vasovagal  Continue IV fluids  EKG on admission NSR  Cardiac enzymes x 1 normal; cycle cardiac enzymes  CT head on admission with no acute intracranial findings  PT evaluation for safe discharge plan Active Problems:   BPH (benign prostatic hyperplasia)  Continue Flomax and Finasteride   Anemia  Hemoglobin 9.7  Heme positive stool  Appreciate GI consult (requested by ED physician) although I think it is reasonable to just continue following CBC for now   Will hold  Aspirin   Manson Passey [facility] [phone]   Review of Systems:  Constitutional: Negative for fever, chills and malaise/fatigue. Negative for diaphoresis.  HENT: Negative for hearing loss, ear pain, nosebleeds, congestion, sore throat, neck pain, tinnitus and ear discharge.   Eyes: Negative for blurred vision, double vision, photophobia, pain, discharge and redness.  Respiratory: Negative for cough, hemoptysis, sputum production, shortness of breath, wheezing and stridor.    Cardiovascular: Negative for chest pain, palpitations, orthopnea, claudication and leg swelling.  Gastrointestinal: Negative for nausea, vomiting and abdominal pain. Negative for heartburn, constipation, blood in stool and melena.  Genitourinary: Negative for dysuria, urgency, frequency, hematuria and flank pain.  Musculoskeletal: Negative for myalgias, back pain, joint pain and positive for fall.  Skin: Negative for itching and rash.  Neurological: syncope Endo/Heme/Allergies: Negative for environmental allergies and polydipsia. Does not bruise/bleed easily.  Psychiatric/Behavioral: Negative for suicidal ideas. The patient is not nervous/anxious.      Past Medical History  Diagnosis Date  . Bowel habit changes     inability to control bowels   . Hypertension   . Vascular degeneration   . Spinal stenosis of lumbar region   . Carcinoid tumor of small intestine, benign   . Acute intestinal ischemia    Past Surgical History  Procedure Laterality Date  . Small intestine surgery      removal of part of small intestine  . Lumbar laminectomy     Social History:  reports that he has quit smoking. He does not have any smokeless tobacco history on file. He reports that he does not drink alcohol or use illicit drugs.  Allergies  Allergen Reactions  . Morphine And Related Other (See Comments)    Combative  . Penicillins Rash    Family History: heart disease in parents  Prior to Admission medications   Medication Sig Start Date End Date Taking? Authorizing Provider  acetaminophen (TYLENOL) 500 MG tablet Take 500 mg by mouth every 6 (six) hours as needed for pain.   Yes Historical Provider, MD  acidophilus (RISAQUAD) CAPS Take 1  capsule by mouth daily.   Yes Historical Provider, MD  aspirin 81 MG tablet Take 81 mg by mouth daily.   Yes Historical Provider, MD  finasteride (PROSCAR) 5 MG tablet Take 5 mg by mouth Daily.  02/12/11  Yes Historical Provider, MD  Tamsulosin HCl (FLOMAX) 0.4  MG CAPS  02/12/11  Yes Historical Provider, MD   Physical Exam: Filed Vitals:   11/19/12 1045 11/19/12 1349 11/19/12 1400 11/19/12 1430  BP: 173/75 172/83 171/95 170/75  Pulse: 86 65 66 71  Temp: 97.8 F (36.6 C) 97.9 F (36.6 C)    TempSrc: Oral Oral    Resp: 17 18 21 18   SpO2: 100% 100% 99% 99%    Physical Exam  Constitutional: Appears well-developed and well-nourished. No distress.  HENT: Normocephalic. External right and left ear normal. Oropharynx is clear and moist.  Eyes: Conjunctivae and EOM are normal. PERRLA, no scleral icterus.  Neck: Normal ROM. Neck supple. No JVD. No tracheal deviation. No thyromegaly.  CVS: RRR, S1/S2 +, no murmurs, no gallops, no carotid bruit.  Pulmonary: Effort and breath sounds normal, no stridor, rhonchi, wheezes, rales.  Abdominal: Soft. BS +,  no distension, tenderness, rebound or guarding.  Musculoskeletal: Normal range of motion. No edema and no tenderness.  Lymphadenopathy: No lymphadenopathy noted, cervical, inguinal. Neuro: Alert. Normal reflexes, muscle tone coordination. No cranial nerve deficit. Skin: Skin is warm and dry. No rash noted. Not diaphoretic. No erythema. No pallor.  Psychiatric: Normal mood and affect. Behavior, judgment, thought content normal.   Labs on Admission:  Basic Metabolic Panel:  Recent Labs Lab 11/19/12 1105  NA 135  K 4.3  CL 101  CO2 24  GLUCOSE 108*  BUN 25*  CREATININE 1.12  CALCIUM 9.5   Liver Function Tests:  Recent Labs Lab 11/19/12 1105  AST 16  ALT 8  ALKPHOS 83  BILITOT 0.4  PROT 7.7  ALBUMIN 3.7   No results found for this basename: LIPASE, AMYLASE,  in the last 168 hours No results found for this basename: AMMONIA,  in the last 168 hours CBC:  Recent Labs Lab 11/19/12 1105  WBC 7.5  NEUTROABS 4.7  HGB 9.7*  HCT 30.6*  MCV 93.0  PLT 323   Cardiac Enzymes:  Recent Labs Lab 11/19/12 1105  TROPONINI <0.30   BNP: No components found with this basename: POCBNP,   CBG: No results found for this basename: GLUCAP,  in the last 168 hours  Radiological Exams on Admission: Ct Head Wo Contrast 11/19/2012  * IMPRESSION: 1.  No acute intracranial abnormalities. 2.  Moderate cerebral and cerebellar atrophy with extensive chronic microvascular ischemic changes in the deep and periventricular white matter of the cerebral hemispheres bilaterally. 3.  Paranasal sinus disease, as above, including evidence of chronic sinusitis in the left maxillary sinus.   Original Report Authenticated By: Trudie Reed, M.D.     EKG: Normal sinus rhythm, no ST/T wave changes  Code Status: Full Family Communication: Pt at bedside Disposition Plan: Admit for further evaluation  Manson Passey, MD  [facility] Pager [phone]  If 7PM-7AM, please contact night-coverage [email or web address] Password [facility] 11/19/2012, 3:39 PM

## 2012-11-20 ENCOUNTER — Inpatient Hospital Stay (HOSPITAL_COMMUNITY): Payer: Medicare Other

## 2012-11-20 ENCOUNTER — Encounter (HOSPITAL_COMMUNITY): Payer: Self-pay | Admitting: Radiology

## 2012-11-20 DIAGNOSIS — D649 Anemia, unspecified: Secondary | ICD-10-CM

## 2012-11-20 DIAGNOSIS — N189 Chronic kidney disease, unspecified: Secondary | ICD-10-CM

## 2012-11-20 DIAGNOSIS — N179 Acute kidney failure, unspecified: Secondary | ICD-10-CM

## 2012-11-20 DIAGNOSIS — R195 Other fecal abnormalities: Secondary | ICD-10-CM | POA: Diagnosis present

## 2012-11-20 LAB — COMPREHENSIVE METABOLIC PANEL
ALT: 6 U/L (ref 0–53)
Alkaline Phosphatase: 72 U/L (ref 39–117)
GFR calc Af Amer: 54 mL/min — ABNORMAL LOW (ref >90)
Glucose, Bld: 100 mg/dL — ABNORMAL HIGH (ref 70–99)
Potassium: 4.2 mEq/L (ref 3.5–5.1)
Sodium: 137 mEq/L (ref 135–145)
Total Protein: 6.7 g/dL (ref 6.0–8.3)

## 2012-11-20 LAB — RETICULOCYTES
RBC.: 3.17 MIL/uL — ABNORMAL LOW (ref 4.22–5.81)
Retic Count, Absolute: 31.7 10*3/uL (ref 19.0–186.0)
Retic Ct Pct: 1 % (ref 0.4–3.1)

## 2012-11-20 LAB — CBC
HCT: 29 % — ABNORMAL LOW (ref 39.0–52.0)
MCH: 29.5 pg (ref 26.0–34.0)
MCHC: 31.7 g/dL (ref 30.0–36.0)
MCV: 92.9 fL (ref 78.0–100.0)
RDW: 13.7 % (ref 11.5–15.5)

## 2012-11-20 MED ORDER — IOHEXOL 300 MG/ML  SOLN
50.0000 mL | Freq: Once | INTRAMUSCULAR | Status: AC | PRN
  Administered 2012-11-20: 50 mL via ORAL

## 2012-11-20 MED ORDER — PANTOPRAZOLE SODIUM 40 MG PO TBEC
40.0000 mg | DELAYED_RELEASE_TABLET | Freq: Every day | ORAL | Status: DC
  Administered 2012-11-20 – 2012-11-21 (×2): 40 mg via ORAL
  Filled 2012-11-20 (×3): qty 1

## 2012-11-20 MED ORDER — IOHEXOL 300 MG/ML  SOLN
100.0000 mL | Freq: Once | INTRAMUSCULAR | Status: AC | PRN
  Administered 2012-11-20: 100 mL via INTRAVENOUS

## 2012-11-20 NOTE — Progress Notes (Signed)
[facility] PROGRESS NOTE  Calvin Harper ZOX:[id number] DOB: 1915/12/15 DOA: 11/19/2012 PCP: Benita Stabile, MD  Assessment/Plan: Syncope -Likely vasovagal response given clinical history, compounded by the patient's use of tamulosin  -Alpha blockade from tamulosin likely has contributed to his vagal type symptoms -Order echocardiogram -Check orthostatic vitals -EEG although clinical suspicion for seizures low -EKG and troponins are reassuring -Chest x-ray, PA and lateral Acute on chronic renal failure -Baseline creatinine 0.9-1.1 -Continue IV fluids -Renal ultrasound if no improvement Positive FOBT -Appreciate GI consultation -Plan for CT abdomen and possible endoscopy -Patient notes a 20 pound weight loss in the past 2 years -Has never had a colonoscopy Normocytic anemia -Baseline hemoglobin 11-12 -Check iron studies -B12, RBC folate -Reticulocyte count only 1% History of ischemic bowel with carcinoid tumor -Status post resection small bowel June 2012     Family Communication:  son at beside Disposition Plan:   Home when medically stable        Procedures/Studies: Ct Head Wo Contrast  11/19/2012  *RADIOLOGY REPORT*  Clinical Data: Syncope.  Intermittent headache and dizziness.  CT HEAD WITHOUT CONTRAST  Technique:  Contiguous axial images were obtained from the base of the skull through the vertex without contrast.  Comparison: MRI of the brain 01/03/2004.  Findings: Moderate cerebral and cerebellar atrophy.  Patchy and confluent areas of decreased attenuation throughout the deep and periventricular white matter of the cerebral hemispheres bilaterally, most compatible with chronic microvascular ischemic disease. No acute intracranial abnormalities.  Specifically, no evidence of acute intracranial hemorrhage, no definite findings of acute/subacute cerebral ischemia, no mass, mass effect, hydrocephalus or abnormal intra or extra-axial fluid collections. The  mastoids are generally well pneumatized bilaterally, although there is a trace amount of fluid in the mastoids posteriorly bilaterally.  Mild mucosal thickening scattered throughout the ethmoid sinuses bilaterally, with extensive mucoperiosteal thickening in the left maxillary sinus which is nearly completely opacified with some high attenuation secretions (likely inspissated secretions).  IMPRESSION: 1.  No acute intracranial abnormalities. 2.  Moderate cerebral and cerebellar atrophy with extensive chronic microvascular ischemic changes in the deep and periventricular white matter of the cerebral hemispheres bilaterally. 3.  Paranasal sinus disease, as above, including evidence of chronic sinusitis in the left maxillary sinus.   Original Report Authenticated By: Trudie Reed, M.D.          Subjective: Patient denies fevers, chills, chest pain, shortness breath, nausea, vomiting, diarrhea, abdominal pain, dysuria, hematuria, rashes. Dizziness has improved.   Objective: Filed Vitals:   11/19/12 1430 11/19/12 1604 11/19/12 2200 11/20/12 0600  BP: 170/75 177/82 145/65 149/76  Pulse: 71 83 80 73  Temp:  97.8 F (36.6 C) 98.1 F (36.7 C) 98.2 F (36.8 C)  TempSrc:  Oral Oral Oral  Resp: 18 18 16 16   Height:  6\' 3"  (1.905 m)    Weight:  66.633 kg (146 lb 14.4 oz)  69.219 kg (152 lb 9.6 oz)  SpO2: 99% 100% 96% 96%    Intake/Output Summary (Last 24 hours) at 11/20/12 1427 Last data filed at 11/20/12 0900  Gross per 24 hour  Intake    240 ml  Output    300 ml  Net    -60 ml   Weight change:  Exam:   General:  Pt is alert, follows commands appropriately, not in acute distress  HEENT: No icterus, No thrush, No neck mass, [place]/AT  Cardiovascular: RRR, S1/S2, no rubs, no gallops  Respiratory: CTA bilaterally, no wheezing, no crackles, no rhonchi  Abdomen: Soft/+BS, non tender, non distended, no guarding  Extremities: No edema, No lymphangitis, No petechiae, No rashes, no  synovitis  Neurologic: Cranial 2-12 grossly intact, strength 5/5 upper and lower extremities; no dysmetria; no dysdiadochokinesia  Data Reviewed: Basic Metabolic Panel:  Recent Labs Lab 11/19/12 1105 11/19/12 1650 11/20/12 0304  NA 135 135 137  K 4.3 3.7 4.2  CL 101 99 105  CO2 24 26 26   GLUCOSE 108* 101* 100*  BUN 25* 20 28*  CREATININE 1.12 1.06 1.27  CALCIUM 9.5 9.5 9.1  MG  --  2.0  --   PHOS  --  2.6  --    Liver Function Tests:  Recent Labs Lab 11/19/12 1105 11/19/12 1650 11/20/12 0304  AST 16 16 15   ALT 8 7 6   ALKPHOS 83 84 72  BILITOT 0.4 0.4 0.2*  PROT 7.7 7.7 6.7  ALBUMIN 3.7 3.6 3.1*   No results found for this basename: LIPASE, AMYLASE,  in the last 168 hours No results found for this basename: AMMONIA,  in the last 168 hours CBC:  Recent Labs Lab 11/19/12 1105 11/19/12 1650 11/20/12 0304  WBC 7.5 9.3 6.9  NEUTROABS 4.7 6.5  --   HGB 9.7* 10.6* 9.2*  HCT 30.6* 33.6* 29.0*  MCV 93.0 93.1 92.9  PLT 323 351 294   Cardiac Enzymes:  Recent Labs Lab 11/19/12 1105 11/19/12 1650 11/19/12 2129 11/20/12 0304  TROPONINI <0.30 <0.30 <0.30 <0.30   BNP: No components found with this basename: POCBNP,  CBG: No results found for this basename: GLUCAP,  in the last 168 hours  No results found for this or any previous visit (from the past 240 hour(s)).   Scheduled Meds: . acidophilus  1 capsule Oral Daily  . finasteride  5 mg Oral Daily  . pantoprazole  40 mg Oral Q0600  . sodium chloride  3 mL Intravenous Q12H  . tamsulosin  0.4 mg Oral Daily   Continuous Infusions: . sodium chloride 75 mL/hr at 11/19/12 1728     Calvin Rayl, DO  [facility] Pager [phone]  If 7PM-7AM, please contact night-coverage [email or web address] Password [email or web address] 11/20/2012, 2:27 PM   LOS: 1 day

## 2012-11-20 NOTE — Consult Note (Signed)
Referring Taylinn Brabant: [facility]  Primary Care Physician:  Benita Stabile, MD Primary Gastroenterologist:  none Reason for Consultation:anemia, heme positive stool  HPI: Calvin Harper is a 79 y.o. male who was admitted last evening after a syncopal episode at home. This is felt most likely secondary to orthostasis/vasovagal episode. On admission patient was noted to have a normocytic anemia with hemoglobin of 9.7 and Hemoccult-positive stool. He has no history of anemia that he is aware of. Medical problems include BPH him a hypertension, spinal stenosis, and history of a carcinoid tumor of the jejunum which was removed in the summer of 2012 and had evidence of perforation at that time. He required a second surgery for small bowel ischemia within that same admission. He had 49 cm of small bowel removed. 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 Patient and family do not feel that he has had any prior endoscopic evaluation. He has no prior history of GI bleeding. He does have some chronic dysphagia issues secondary to esophageal dysmotility which was evaluated by barium swallow and speech path within the past couple of years. He denies any current sense of food sticking, no chronic heartburn indigestion etc. He denies any abdominal pain, but says every now and then he gets a little cramping in the area of his "surgery". He has had much more frequent and looser stools since the surgery but has not noted any melena or hematochezia. He is on a baby aspirin at home no NSAIDs. Family history is negative for colon cancer. Since admission he has not shown any evidence of active bleeding, hemoglobin is 9.2 hematocrit 28 this morning.   Past Medical History  Diagnosis Date  . Bowel habit changes     inability to control bowels   . Hypertension   . Vascular degeneration   . Spinal stenosis of lumbar region   . Carcinoid tumor of small intestine, benign   . Acute  intestinal ischemia     Past Surgical History  Procedure Laterality Date  . Small intestine surgery      removal of part of small intestine  . Lumbar laminectomy      Prior to Admission medications   Medication Sig Start Date End Date Taking? Authorizing Doha Boling  acetaminophen (TYLENOL) 500 MG tablet Take 500 mg by mouth every 6 (six) hours as needed for pain.   Yes Historical Kadir Azucena, MD  acidophilus (RISAQUAD) CAPS Take 1 capsule by mouth daily.   Yes Historical Cortavius Montesinos, MD  aspirin 81 MG tablet Take 81 mg by mouth daily.   Yes Historical Shannen Vernon, MD  finasteride (PROSCAR) 5 MG tablet Take 5 mg by mouth Daily.  02/12/11  Yes Historical Damaria Stofko, MD  Tamsulosin HCl (FLOMAX) 0.4 MG CAPS  02/12/11  Yes Historical Gaylin Osoria, MD    Current Facility-Administered Medications  Medication Dose Route Frequency Kian Ottaviano Last Rate Last Dose  . 0.9 %  sodium chloride infusion   Intravenous Continuous Alison Murray, MD 75 mL/hr at 11/19/12 1728    . acetaminophen (TYLENOL) tablet 650 mg  650 mg Oral Q6H PRN Alison Murray, MD   650 mg at 11/20/12 0315   Or  . acetaminophen (TYLENOL) suppository 650 mg  650 mg Rectal Q6H PRN Alison Murray, MD      . acidophilus (RISAQUAD) capsule 1 capsule  1 capsule Oral Daily Alison Murray, MD   1 capsule at 11/20/12 1020  . finasteride (PROSCAR) tablet 5 mg  5 mg Oral Daily  Alison Murray, MD   5 mg at 11/20/12 1020  . HYDROcodone-acetaminophen (NORCO/VICODIN) 5-325 MG per tablet 1-2 tablet  1-2 tablet Oral Q4H PRN Alison Murray, MD      . ondansetron [facility]) tablet 4 mg  4 mg Oral Q6H PRN Alison Murray, MD       Or  . ondansetron [facility]) injection 4 mg  4 mg Intravenous Q6H PRN Alison Murray, MD      . sodium chloride 0.9 % injection 3 mL  3 mL Intravenous Q12H Alison Murray, MD      . tamsulosin [facility]) capsule 0.4 mg  0.4 mg Oral Daily Alison Murray, MD   0.4 mg at 11/20/12 1020    Allergies as of 11/19/2012 - Review Complete 11/19/2012  Allergen  Reaction Noted  . Morphine and related Other (See Comments) 03/12/2011  . Penicillins Rash 03/12/2011    Family hx non-contributory  History   Social History  . Marital Status: Widowed                       Social History Main Topics  . Smoking status: Former Games developer  . Smokeless tobacco: Not on file  . Alcohol Use: No  . Drug Use: No  .      Review of Systems: Pertinent positive and negative review of systems were noted in the above HPI section.  All other review of systems was otherwise negative. Physical Exam: Vital signs in last 24 hours: Temp:  [97.8 F (36.6 C)-98.2 F (36.8 C)] 98.2 F (36.8 C) (03/09 0600) Pulse Rate:  [65-83] 73 (03/09 0600) Resp:  [16-21] 16 (03/09 0600) BP: (145-177)/(65-95) 149/76 mmHg (03/09 0600) SpO2:  [96 %-100 %] 96 % (03/09 0600) Weight:  [146 lb 14.4 oz (66.633 kg)-152 lb 9.6 oz (69.219 kg)] 152 lb 9.6 oz (69.219 kg) (03/09 0600) Last BM Date: 11/18/12 General:   Alert,  Well-developed,thin elderly WM , pleasant and cooperative in NAD,family at bedside Head:  Normocephalic and atraumatic. Eyes:  Sclera clear, no icterus.   Conjunctiva pink. Ears:  Normal auditory acuity. Nose:  No deformity, discharge,  or lesions. Mouth:  No deformity or lesions.   Neck:  Supple; no masses or thyromegaly. Lungs:  Clear throughout to auscultation.   No wheezes, crackles, or rhonchi. Heart:  Regular rate and rhythm; no murmurs, clicks, rubs,  or gallops. Abdomen:  Soft,nontender, BS active,nonpalp mass or hsm. ,small midline incisional scar  Rectal:  Deferred , documented heme + and brown Msk:  Symmetrical without gross deformities. . Pulses:  Normal pulses noted. Extremities:  Without clubbing or edema. Neurologic:  Alert and  oriented x4;  grossly normal neurologically. Skin:  Intact without significant lesions or rashes.. Psych:  Alert and cooperative. Normal mood and affect.    Lab Results:  Recent Labs  11/19/12 1105 11/19/12 1650  11/20/12 0304  WBC 7.5 9.3 6.9  HGB 9.7* 10.6* 9.2*  HCT 30.6* 33.6* 29.0*  PLT 323 351 294   BMET  Recent Labs  11/19/12 1105 11/19/12 1650 11/20/12 0304  NA 135 135 137  K 4.3 3.7 4.2  CL 101 99 105  CO2 24 26 26   GLUCOSE 108* 101* 100*  BUN 25* 20 28*  CREATININE 1.12 1.06 1.27  CALCIUM 9.5 9.5 9.1   LFT  Recent Labs  11/20/12 0304  PROT 6.7  ALBUMIN 3.1*  AST 15  ALT 6  ALKPHOS 72  BILITOT 0.2*  PT/INR  Recent Labs  11/19/12 1650  LABPROT 14.1  INR 1.10     Studies/Results: Ct Head Wo Contrast  11/19/2012  *RADIOLOGY REPORT*  Clinical Data: Syncope.  Intermittent headache and dizziness.  CT HEAD WITHOUT CONTRAST  Technique:  Contiguous axial images were obtained from the base of the skull through the vertex without contrast.  Comparison: MRI of the brain 01/03/2004.  Findings: Moderate cerebral and cerebellar atrophy.  Patchy and confluent areas of decreased attenuation throughout the deep and periventricular white matter of the cerebral hemispheres bilaterally, most compatible with chronic microvascular ischemic disease. No acute intracranial abnormalities.  Specifically, no evidence of acute intracranial hemorrhage, no definite findings of acute/subacute cerebral ischemia, no mass, mass effect, hydrocephalus or abnormal intra or extra-axial fluid collections. The mastoids are generally well pneumatized bilaterally, although there is a trace amount of fluid in the mastoids posteriorly bilaterally.  Mild mucosal thickening scattered throughout the ethmoid sinuses bilaterally, with extensive mucoperiosteal thickening in the left maxillary sinus which is nearly completely opacified with some high attenuation secretions (likely inspissated secretions).  IMPRESSION: 1.  No acute intracranial abnormalities. 2.  Moderate cerebral and cerebellar atrophy with extensive chronic microvascular ischemic changes in the deep and periventricular white matter of the cerebral  hemispheres bilaterally. 3.  Paranasal sinus disease, as above, including evidence of chronic sinusitis in the left maxillary sinus.   Original Report Authenticated By: Trudie Reed, M.D.     IMPRESSION:  #33  79 year old male with normocytic anemia and Hemoccult-positive stool. Patient asymptomatic. He has history of a carcinoid tumor of the jejunum which was surgically resected in 2012. His course was complicated by small bowel ischemia and he required a second surgery with removal of 49 cm of small bowel during that same admission. Could have  occult malignancy gastric colonic or small bowel,  aspirin-induced gastropathy or peptic ulcer disease #2 syncopal episode felt to be vasovagal/medication-related #3 spinal stenosis #4 BPH #5 chronic mild dysphagia secondary to esophageal dysmotility    PLAN: #1 check iron studies and B12 #2 serial hemoglobins for now #3 Will check CT of the abdomen and pelvis initially to rule out any recurrent small bowel lesion as source for heme positive stool. I discussed endoscopic evaluation with EGD and colonoscopy with patient and his family. He is willing to proceed. I think this should be done inpatient given her advanced age etc. Will await CT findings and then decide on EGD/COLON #4 add PPI for now   Amy Charlie [facility]  11/20/2012, 11:16 AM   [facility] Attending  I have also seen and assessed the patient and agree with the above note. My hx and findings same. Additions above and below.  CT results - viewed  IMPRESSION:  1. Mild to moderate motion degradation throughout.  2. No definite explanation for heme positive stool. There is  moderate stool in the rectum, suggesting constipation or fecal  impaction.  3. Surgical changes within the jejunum. Surrounding soft tissue  density is favored to represent underdistended postsurgical  changes. No surrounding mesenteric abnormality to suggest locally  recurrent tumor.  4. Urinary bladder distention,  suggesting a component of bladder  outlet obstruction.  Original Report Authenticated By: Jeronimo Greaves, M.D.    Discussed w/ patient and family  Hard to know what to do w/ DRE heme + stool (brown) in this setting. Await iron studies and B12 and will revisit re: endoscopic evaluation. He is not inclined. He could go home and see me as outpatient if  needed, once syncope eval complete. I think Dr. Don Perking assessment of vasovagal syncope and tamulosin effect likely correct.   Iva Boop, MD, [facility] [phone] (pager) 11/20/2012 6:05 PM

## 2012-11-21 ENCOUNTER — Other Ambulatory Visit (HOSPITAL_COMMUNITY): Payer: Medicare Other

## 2012-11-21 LAB — FERRITIN: Ferritin: 30 ng/mL (ref 22–322)

## 2012-11-21 LAB — BASIC METABOLIC PANEL
BUN: 22 mg/dL (ref 6–23)
Creatinine, Ser: 1.15 mg/dL (ref 0.50–1.35)
GFR calc Af Amer: 61 mL/min — ABNORMAL LOW (ref >90)
GFR calc non Af Amer: 53 mL/min — ABNORMAL LOW (ref >90)

## 2012-11-21 LAB — CBC
MCH: 28.3 pg (ref 26.0–34.0)
MCHC: 30.8 g/dL (ref 30.0–36.0)
RDW: 13.6 % (ref 11.5–15.5)

## 2012-11-21 LAB — IRON AND TIBC: UIBC: 316 ug/dL (ref 125–400)

## 2012-11-21 LAB — MAGNESIUM: Magnesium: 2 mg/dL (ref 1.5–2.5)

## 2012-11-21 LAB — TSH: TSH: 2.103 u[IU]/mL (ref 0.350–4.500)

## 2012-11-21 LAB — GLUCOSE, CAPILLARY: Glucose-Capillary: 100 mg/dL — ABNORMAL HIGH (ref 70–99)

## 2012-11-21 MED ORDER — PANTOPRAZOLE SODIUM 40 MG PO TBEC: 40.0000 mg | DELAYED_RELEASE_TABLET | Freq: Every day | ORAL | Status: DC

## 2012-11-21 MED ORDER — FERROUS SULFATE 325 (65 FE) MG PO TABS
325.0000 mg | ORAL_TABLET | Freq: Two times a day (BID) | ORAL | Status: DC
  Administered 2012-11-21: 325 mg via ORAL
  Filled 2012-11-21 (×2): qty 1

## 2012-11-21 MED ORDER — FERROUS SULFATE 325 (65 FE) MG PO TABS: 325.0000 mg | ORAL_TABLET | Freq: Two times a day (BID) | ORAL | Status: AC

## 2012-11-21 NOTE — Progress Notes (Signed)
*  PRELIMINARY RESULTS* Echocardiogram 2D Echocardiogram has been performed.  Calvin Harper 11/21/2012, 12:45 PM

## 2012-11-21 NOTE — Progress Notes (Signed)
[facility] Progress Note  SUBJECTIVE: feels fine, eager to go home.  OBJECTIVE:  Vital signs in last 24 hours: Temp:  [98 F (36.7 C)-98.3 F (36.8 C)] 98 F (36.7 C) (03/10 0600) Pulse Rate:  [61-70] 69 (03/10 0600) Resp:  [18-20] 18 (03/10 0600) BP: (132-144)/(71-79) 132/71 mmHg (03/10 0600) SpO2:  [98 %-99 %] 98 % (03/10 0600) Weight:  [148 lb 11.2 oz (67.45 kg)] 148 lb 11.2 oz (67.45 kg) (03/10 0600) Last BM Date: 11/20/12 General:    white male in NAD Heart:  Regular rate and rhythm\ Abdomen:  Soft, nontender and nondistended. Normal bowel sounds. Neurologic:  Alert and oriented,  grossly normal neurologically. Psych:  Cooperative. Normal mood and affect.    Lab Results:  Recent Labs  11/19/12 1650 11/20/12 0304 11/21/12 0438  WBC 9.3 6.9 8.0  HGB 10.6* 9.2* 8.6*  HCT 33.6* 29.0* 27.9*  PLT 351 294 250   BMET  Recent Labs  11/19/12 1650 11/20/12 0304 11/21/12 0438  NA 135 137 137  K 3.7 4.2 4.2  CL 99 105 106  CO2 26 26 24   GLUCOSE 101* 100* 103*  BUN 20 28* 22  CREATININE 1.06 1.27 1.15  CALCIUM 9.5 9.1 9.0   LFT  Recent Labs  11/20/12 0304  PROT 6.7  ALBUMIN 3.1*  AST 15  ALT 6  ALKPHOS 72  BILITOT 0.2*   PT/INR  Recent Labs  11/19/12 1650  LABPROT 14.1  INR 1.10    Studies/Results: Dg Chest 2 View  11/20/2012  *RADIOLOGY REPORT*  Clinical Data: Syncope.  Hypertension.  CHEST - 2 VIEW  Comparison: Single view of the chest 02/27/2011.  Findings: The chest is hyperexpanded but the lungs are clear. Aspirated barium in the right middle lobe is unchanged.  No pneumothorax or pleural effusion.  No focal bony abnormality. Heart size is normal.  IMPRESSION: No acute finding.   Original Report Authenticated By: Holley Dexter, M.D.    Ct Head Wo Contrast  11/19/2012  *RADIOLOGY REPORT*  Clinical Data: Syncope.  Intermittent headache and dizziness.  CT HEAD WITHOUT CONTRAST  Technique:  Contiguous axial images were obtained from  the base of the skull through the vertex without contrast.  Comparison: MRI of the brain 01/03/2004.  Findings: Moderate cerebral and cerebellar atrophy.  Patchy and confluent areas of decreased attenuation throughout the deep and periventricular white matter of the cerebral hemispheres bilaterally, most compatible with chronic microvascular ischemic disease. No acute intracranial abnormalities.  Specifically, no evidence of acute intracranial hemorrhage, no definite findings of acute/subacute cerebral ischemia, no mass, mass effect, hydrocephalus or abnormal intra or extra-axial fluid collections. The mastoids are generally well pneumatized bilaterally, although there is a trace amount of fluid in the mastoids posteriorly bilaterally.  Mild mucosal thickening scattered throughout the ethmoid sinuses bilaterally, with extensive mucoperiosteal thickening in the left maxillary sinus which is nearly completely opacified with some high attenuation secretions (likely inspissated secretions).  IMPRESSION: 1.  No acute intracranial abnormalities. 2.  Moderate cerebral and cerebellar atrophy with extensive chronic microvascular ischemic changes in the deep and periventricular white matter of the cerebral hemispheres bilaterally. 3.  Paranasal sinus disease, as above, including evidence of chronic sinusitis in the left maxillary sinus.   Original Report Authenticated By: Trudie Reed, M.D.    Ct Abdomen Pelvis W Contrast  11/20/2012  *RADIOLOGY REPORT*  Clinical Data: Anemia.  Heme positive stool.  History of carcinoid tumor of small bowel.  Status post resection in 2012.  CT  ABDOMEN AND PELVIS WITH CONTRAST  Technique:  Multidetector CT imaging of the abdomen and pelvis was performed following the standard protocol during bolus administration of intravenous contrast.  Contrast: 50mL OMNIPAQUE IOHEXOL 300 MG/ML  SOLN, OMNIPAQUE IOHEXOL 300 MG/ML  SOLN  Comparison: 02/21/2011  Findings: Lung bases:  Radiopaque  densities at the right lung base are chronic and likely the sequelae of aspirated barium.  Normal heart size with coronary artery atherosclerosis.  Abdomen/pelvis:  Right hepatic lobe cysts.  Normal spleen.  Motion degradation throughout.  Normal stomach.  Moderate pancreatic atrophy.  Normal gallbladder, biliary tract, adrenal glands.  Mild bilateral renal cortical atrophy.  Sub centimeter interpolar left renal cyst.  Ulcerative plaque within the abdominal aorta. No retroperitoneal or retrocrural adenopathy.  A stool ball within the rectum measures 6.5 cm. Scattered colonic diverticula.  Normal terminal ileum.  The appendix may be identified on image 55/series 2.  No evidence of right lower quadrant inflammation.  Surgical sutures within the jejunum on image 55/series 2. Surrounding soft tissue density is favored to be due to underdistension.  No small bowel obstruction.  No convincing evidence of small bowel mass.  No mesenteric adenopathy or calcified mesenteric mass. No evidence of omental or peritoneal disease.  Mild prominence of the left internal iliac artery is chronic. No pelvic adenopathy.  The urinary bladder is distended, extending into the upper right pelvis.  There are small saccules. Normal prostate, without significant free pelvic fluid.  Bones/Musculoskeletal:  Moderate osteopenia.  IMPRESSION: 1.  Mild to moderate motion degradation throughout. 2.  No definite explanation for heme positive stool.  There is moderate stool in the rectum, suggesting constipation or fecal impaction.  3.  Surgical changes within the jejunum.  Surrounding soft tissue density is favored to represent underdistended postsurgical changes.  No surrounding mesenteric abnormality to suggest locally recurrent tumor. 4.  Urinary bladder distention, suggesting a component of bladder outlet obstruction.   Original Report Authenticated By: Jeronimo Greaves, M.D.      ASSESSMENT / PLAN:  1. Normocytic anemia / heme positive. Hgb  Sept 2012 was 12, it was 9.7 at time of this admission and down to 8.6 today. Folate, B12 normal. Ferrin low normal at 30 with TIBC of 345 and 8% saturation. Iron studies not clearly c/w iron deficiency but probably so w/ ferritin only 30 and low sat.. Patient wants to go home this am. He doesn't want to pursue any inpatient GI workup (if any at all). Plan is to  Start ferrous sulfate and have him see his PCP re: further evaluation. It would be appropriate to do colonoscopy +/- EGd in my opinion but not urgent and would be good to discuss with PCP. He and family understand GI cancer not excluded.  2. Syncope felt to be vasovagal / medication related.     LOS: 2 days   Willette Cluster  11/21/2012, 9:40 AM   [facility] Attending  I have also seen and assessed the patient and agree with the above note.  See above  1) home 2) see PCP and discuss further endoscopic evaluation - ? To do 3) see me if that is plan 4) Ferrous sulfate  Iva Boop, MD, [facility] [phone] (pager) 11/21/2012 11:29 AM

## 2012-11-21 NOTE — Clinical Documentation Improvement (Signed)
BMI DOCUMENTATION CLARIFICATION QUERY  THIS DOCUMENT IS NOT A PERMANENT PART OF THE MEDICAL RECORD  TO RESPOND TO THE THIS QUERY, FOLLOW THE INSTRUCTIONS BELOW:  1. If needed, update documentation for the patient's encounter via the notes activity.  2. Access this query again and click edit on the In [employer].  3. After updating, or not, click F2 to complete all highlighted (required) fields concerning your review. Select "additional documentation in the medical record" OR "no additional documentation provided".  4. Click Sign note button.  5. The deficiency will fall out of your In Basket *Please let us know if you are not able to complete this workflow by phone or e-mail (listed below).         11/21/12  Dear Dr. Arbutus Leas Marton Redwood  In an effort to better capture your patient's severity of illness, reflect appropriate length of stay and utilization of resources, a review of the patient medical record has revealed the following indicators.    Based on your clinical judgment, please clarify and document in a progress note and/or discharge summary the clinical condition associated with the following supporting information:  In responding to this query please exercise your independent judgment.  The fact that a query is asked, does not imply that any particular answer is desired or expected. According to the documented Height and Weight in CHL/EPIC, the patients BMI is less than 19. If your clinical findings/judgment agrees with this, please document this along with the related diagnosis in the progress note and discharge summary. THANK YOU  Possible Clinical conditions   Underweight w/BMI=18.59  Other condition--weight loss, unclear reason_______________  Cannot Clinically determine _____________ Risk Factors: s/p  resection small bowel  in 2012, Anemia,  Signs & Symptoms:11/20/12 "-Patient notes a 20 pound weight loss in the past 2 years"  Weight: 148 lbs   Height: 53ft  3inches   BMI = 18.59  Diagnostics: Lab: Glucose 100-110 Medications: Protonix, Zofran     Reviewed: additional documentation in the medical record  Thank Barrie Dunker  Clinical Documentation Specialist:  Pager [phone] Office # [phone] Health Information Management [facility]

## 2012-11-21 NOTE — Evaluation (Signed)
Physical Therapy Evaluation Patient Details Name: Calvin WICKE MRN: [id number] DOB: 03-20-1916 Today's Date: 11/21/2012 Time: 0865-7846 PT Time Calculation (min): 14 min  PT Assessment / Plan / Recommendation Clinical Impression  79 yo male admitted with syncopal episode, dizziness. Pt lives alone. On eval pt required Min guard-Min assist for ambulation, 160 feet, without assistive device.Modified Independent with other tasks. Recommend pt use RW for ambulation in home. Recommend HHPT for balance training and general strengthening.     PT Assessment  Patient needs continued PT services    Follow Up Recommendations  Home health PT    Does the patient have the potential to tolerate intense rehabilitation      Barriers to Discharge        Equipment Recommendations  None recommended by PT    Recommendations for Other Services OT consult   Frequency Min 3X/week    Precautions / Restrictions Precautions Precautions: Fall Restrictions Weight Bearing Restrictions: No   Pertinent Vitals/Pain No pain. Pt denied dizziness throughout session      Mobility  Bed Mobility Bed Mobility: Supine to Sit Supine to Sit: 7: Independent;HOB elevated Transfers Transfers: Sit to Stand;Stand to Sit Sit to Stand: 6: Modified independent (Device/Increase time);From bed Stand to Sit: 6: Modified independent (Device/Increase time);To chair/3-in-1 Ambulation/Gait Ambulation/Gait Assistance: 4: Min assist;4: Min guard Ambulation Distance (Feet): 160 Feet Assistive device: None Ambulation/Gait Assistance Details: 1-2 instances of stumbles/wavering-min assist only at those times. Otherwise, pt was min guard assist. good gait speed. discussed use of RW for improved safety/stability.     Exercises     PT Diagnosis: Difficulty walking  PT Problem List: Decreased mobility;Decreased balance PT Treatment Interventions: DME instruction;Gait training;Functional mobility training;Therapeutic  activities;Therapeutic exercise;Balance training;Wheelchair mobility training   PT Goals Acute Rehab PT Goals PT Goal Formulation: With patient Time For Goal Achievement: 11/28/12 Potential to Achieve Goals: Good Pt will go Sit to Stand: with modified independence PT Goal: Sit to Stand - Progress: Goal set today Pt will Ambulate: >150 feet;with modified independence;with rolling walker PT Goal: Ambulate - Progress: Goal set today  Visit Information  Last PT Received On: 11/21/12 Assistance Needed: +1    Subjective Data  Subjective: I feel good. Ready to get out of here Patient Stated Goal: home   Prior Functioning  Home Living Lives With: Alone (son checks on) Type of Home:  [place] Bathroom Toilet: Handicapped height Home Adaptive Equipment: Straight cane;Walker - rolling;Wheelchair - manual Prior Function Level of Independence: Independent with assistive device(s) Driving: No Vocation: Retired Comments: pt occasionally uses Scientist, water quality: No difficulties    Cognition  Cognition Overall Cognitive Status: Appears within functional limits for tasks assessed/performed Arousal/Alertness: Awake/alert Orientation Level: Appears intact for tasks assessed Behavior During Session: [facility] for tasks performed    Extremity/Trunk Assessment Right Lower Extremity Assessment RLE ROM/Strength/Tone: [facility] for tasks assessed Left Lower Extremity Assessment LLE ROM/Strength/Tone: [facility] for tasks assessed   Balance    End of Session PT - End of Session Equipment Utilized During Treatment: Gait belt Activity Tolerance: Patient tolerated treatment well Patient left: in chair;with call bell/phone within reach;with family/visitor present  GP     Rebeca Alert, MPT Pager: [phone]

## 2012-11-21 NOTE — Discharge Summary (Addendum)
Physician Discharge Summary  Calvin Harper:[id number] DOB: [birth year] DOA: 11/19/2012  PCP: Benita Stabile, MD  Admit date: 11/19/2012 Discharge date: 11/21/2012  Recommendations for Outpatient Follow-up:  1. Pt will need to follow up with PCP in 2 weeks post discharge 2. Please obtain BMP to evaluate electrolytes and kidney function 3. Please also check CBC to evaluate Hg and Hct levels 4. Follow up with Dr. Stan Head in 2-4 weeks  Discharge Diagnoses:  Principal Problem:   Syncope Active Problems:   BPH (benign prostatic hyperplasia)   Anemia   Acute on chronic renal failure   Heme + stool Syncope  -Likely vasovagal response given clinical history, compounded by the patient's use of tamulosin  -Alpha blockade from tamulosin likely has contributed to his vagal type symptoms  -Order echocardiogram--normal EF, no LV outlet obstruction, no severe AS -Check orthostatic vitals--negative -EEG--was ordered, but not performed due to the patient's insistence of wanting to go home. Overall clinical suspicion for seizure disorder is very low -EKG and troponins are reassuring  -Chest x-ray, PA and lateral--no acute findings -With fluid hydration, the patient's dizziness improved and remained asymptomatic for the duration of the hospitalization -Patient was instructed to change positions slowly in the future -He will need to discuss the benefits and risks of continuation of tamulosin with his primary care provider Acute on chronic renal failure  -Baseline creatinine 0.9-1.1  -Continue IV fluids  -Renal ultrasound if no improvement  Positive FOBT  -Appreciate GI consultation and input -In light of patient not wanting inpatient workup at this time, the patient will followup with [facility] as outpt -Patient notes a 20 pound weight loss in the past 2 years  -Has never had a colonoscopy  -CT abdomen and pelvis showed moderate retained stool with postsurgical changes in the jejunum.  There was a distended urinary bladder -Patient is able to urinate without difficulty Normocytic anemia--partly due to iron deficiency -Baseline hemoglobin 11-12  -Hemoglobin 8.6 on the day of discharge, but part of this is due to fluid dilution -Check iron studies--iron saturation 8%, ferritin 30  -B12--1479, folate--9.4 -Reticulocyte count only 1%  -Start ferrous sulfate 325 mg twice a day History of ischemic bowel with carcinoid tumor  -Status post resection small bowel June 2012 Weight Loss, unknown reason at this time -pt to have endoscopy as outpatient -lost 20 pounds in past 24 months -BMI--approx. 19   Discharge Condition: stable  Disposition:  Follow-up Information   Follow up with [facility].   Contact information:   [address] [id number] [place] [address] [phone]      Follow up with [facility].   Contact information:   [address] [id number] [place] [address] [phone]      Diet:regular Wt Readings from Last 3 Encounters:  11/21/12 67.45 kg (148 lb 11.2 oz)  09/30/12 68.493 kg (151 lb)  01/25/12 72.576 kg (160 lb)    History of present illness:  79 year old male with no significant past medical history except for BPH presented to [facility] ED status post fall at home. Patient reported he has lost consciousness he thinks for few seconds. He recalls he fell as he has attempted to get up from the bed. No reports of prodromal symptoms prior to fall including shortness of breath, palpitations or chest pain. NO abdominal pain, no nausea or vomiting. No reports of constipation or diarrhea. No reports of fever or chills or cough.  In  ED, evaluation included CT head which did not reveal acute intracranial findings. His BMET was unremarkable. CBC revealed hemoglobin of 9.7 and heme positive stool. GI was consulted by an ED physician.     Consultants: Reader  GI  Discharge Exam: Filed Vitals:   11/21/12 1331  BP: 119/63  Pulse: 76  Temp:   Resp:    Filed Vitals:   11/21/12 0600 11/21/12 1329 11/21/12 1330 11/21/12 1331  BP: 132/71 131/65 136/65 119/63  Pulse: 69 67 76 76  Temp: 98 F (36.7 C) 98.4 F (36.9 C)    TempSrc: Oral Oral    Resp: 18 20    Height:      Weight: 67.45 kg (148 lb 11.2 oz)     SpO2: 98% 99% 100% 98%   General: A&O x 3, NAD, pleasant, cooperative Cardiovascular: RRR, no rub, no gallop, no S3 Respiratory: CTAB, no wheeze, no rhonchi Abdomen:soft, nontender, nondistended, positive bowel sounds Extremities: No edema, No lymphangitis, no petechiae  Discharge Instructions      Discharge Orders   Future Appointments Provider Department Dept Phone   11/22/2012 8:30 AM [facility] EEG [phone]   12/01/2012 9:30 AM [facility] [place] [phone]   01/04/2013 7:45 AM Sherrie George, MD [facility] [phone]   Future Orders Complete By Expires     Diet - low sodium heart healthy  As directed     Discharge instructions  As directed     Comments:      Discuss with primary care physician regarding continuation of tamulosin Iron sulfate over the counter 325mg  twice a day    Increase activity slowly  As directed         Medication List    TAKE these medications       acetaminophen 500 MG tablet  Commonly known as:  TYLENOL  Take 500 mg by mouth every 6 (six) hours as needed for pain.     acidophilus Caps  Take 1 capsule by mouth daily.     aspirin 81 MG tablet  Take 81 mg by mouth daily.     ferrous sulfate 325 (65 FE) MG tablet  Take 1 tablet (325 mg total) by mouth 2 (two) times daily with a meal.     finasteride 5 MG tablet  Commonly known as:  PROSCAR  Take 5 mg by mouth Daily.     pantoprazole 40 MG tablet  Commonly known as:  PROTONIX  Take 1 tablet (40 mg total) by mouth daily at 6 (six) AM.     tamsulosin  0.4 MG Caps  Commonly known as:  FLOMAX         The results of significant diagnostics from this hospitalization (including imaging, microbiology, ancillary and laboratory) are listed below for reference.    Significant Diagnostic Studies: Dg Chest 2 View  11/20/2012  *RADIOLOGY REPORT*  Clinical Data: Syncope.  Hypertension.  CHEST - 2 VIEW  Comparison: Single view of the chest 02/27/2011.  Findings: The chest is hyperexpanded but the lungs are clear. Aspirated barium in the right middle lobe is unchanged.  No pneumothorax or pleural effusion.  No focal bony abnormality. Heart size is normal.  IMPRESSION: No acute finding.   Original Report Authenticated By: Holley Dexter, M.D.    Ct Head Wo Contrast  11/19/2012  *RADIOLOGY REPORT*  Clinical Data: Syncope.  Intermittent headache and dizziness.  CT HEAD WITHOUT CONTRAST  Technique:  Contiguous  axial images were obtained from the base of the skull through the vertex without contrast.  Comparison: MRI of the brain 01/03/2004.  Findings: Moderate cerebral and cerebellar atrophy.  Patchy and confluent areas of decreased attenuation throughout the deep and periventricular white matter of the cerebral hemispheres bilaterally, most compatible with chronic microvascular ischemic disease. No acute intracranial abnormalities.  Specifically, no evidence of acute intracranial hemorrhage, no definite findings of acute/subacute cerebral ischemia, no mass, mass effect, hydrocephalus or abnormal intra or extra-axial fluid collections. The mastoids are generally well pneumatized bilaterally, although there is a trace amount of fluid in the mastoids posteriorly bilaterally.  Mild mucosal thickening scattered throughout the ethmoid sinuses bilaterally, with extensive mucoperiosteal thickening in the left maxillary sinus which is nearly completely opacified with some high attenuation secretions (likely inspissated secretions).  IMPRESSION: 1.  No acute intracranial  abnormalities. 2.  Moderate cerebral and cerebellar atrophy with extensive chronic microvascular ischemic changes in the deep and periventricular white matter of the cerebral hemispheres bilaterally. 3.  Paranasal sinus disease, as above, including evidence of chronic sinusitis in the left maxillary sinus.   Original Report Authenticated By: Trudie Reed, M.D.    Ct Abdomen Pelvis W Contrast  11/20/2012  *RADIOLOGY REPORT*  Clinical Data: Anemia.  Heme positive stool.  History of carcinoid tumor of small bowel.  Status post resection in 2012.  CT ABDOMEN AND PELVIS WITH CONTRAST  Technique:  Multidetector CT imaging of the abdomen and pelvis was performed following the standard protocol during bolus administration of intravenous contrast.  Contrast: 50mL OMNIPAQUE IOHEXOL 300 MG/ML  SOLN, OMNIPAQUE IOHEXOL 300 MG/ML  SOLN  Comparison: 02/21/2011  Findings: Lung bases:  Radiopaque densities at the right lung base are chronic and likely the sequelae of aspirated barium.  Normal heart size with coronary artery atherosclerosis.  Abdomen/pelvis:  Right hepatic lobe cysts.  Normal spleen.  Motion degradation throughout.  Normal stomach.  Moderate pancreatic atrophy.  Normal gallbladder, biliary tract, adrenal glands.  Mild bilateral renal cortical atrophy.  Sub centimeter interpolar left renal cyst.  Ulcerative plaque within the abdominal aorta. No retroperitoneal or retrocrural adenopathy.  A stool ball within the rectum measures 6.5 cm. Scattered colonic diverticula.  Normal terminal ileum.  The appendix may be identified on image 55/series 2.  No evidence of right lower quadrant inflammation.  Surgical sutures within the jejunum on image 55/series 2. Surrounding soft tissue density is favored to be due to underdistension.  No small bowel obstruction.  No convincing evidence of small bowel mass.  No mesenteric adenopathy or calcified mesenteric mass. No evidence of omental or peritoneal disease.  Mild  prominence of the left internal iliac artery is chronic. No pelvic adenopathy.  The urinary bladder is distended, extending into the upper right pelvis.  There are small saccules. Normal prostate, without significant free pelvic fluid.  Bones/Musculoskeletal:  Moderate osteopenia.  IMPRESSION: 1.  Mild to moderate motion degradation throughout. 2.  No definite explanation for heme positive stool.  There is moderate stool in the rectum, suggesting constipation or fecal impaction.  3.  Surgical changes within the jejunum.  Surrounding soft tissue density is favored to represent underdistended postsurgical changes.  No surrounding mesenteric abnormality to suggest locally recurrent tumor. 4.  Urinary bladder distention, suggesting a component of bladder outlet obstruction.   Original Report Authenticated By: Jeronimo Greaves, M.D.      Microbiology: No results found for this or any previous visit (from the past 240 hour(s)).   Labs: Basic  Metabolic Panel:  Recent Labs Lab 11/19/12 1105 11/19/12 1650 11/20/12 0304 11/21/12 0438  NA 135 135 137 137  K 4.3 3.7 4.2 4.2  CL 101 99 105 106  CO2 24 26 26 24   GLUCOSE 108* 101* 100* 103*  BUN 25* 20 28* 22  CREATININE 1.12 1.06 1.27 1.15  CALCIUM 9.5 9.5 9.1 9.0  MG  --  2.0  --  2.0  PHOS  --  2.6  --   --    Liver Function Tests:  Recent Labs Lab 11/19/12 1105 11/19/12 1650 11/20/12 0304  AST 16 16 15   ALT 8 7 6   ALKPHOS 83 84 72  BILITOT 0.4 0.4 0.2*  PROT 7.7 7.7 6.7  ALBUMIN 3.7 3.6 3.1*   No results found for this basename: LIPASE, AMYLASE,  in the last 168 hours No results found for this basename: AMMONIA,  in the last 168 hours CBC:  Recent Labs Lab 11/19/12 1105 11/19/12 1650 11/20/12 0304 11/21/12 0438  WBC 7.5 9.3 6.9 8.0  NEUTROABS 4.7 6.5  --   --   HGB 9.7* 10.6* 9.2* 8.6*  HCT 30.6* 33.6* 29.0* 27.9*  MCV 93.0 93.1 92.9 91.8  PLT 323 351 294 250   Cardiac Enzymes:  Recent Labs Lab 11/19/12 1105  11/19/12 1650 11/19/12 2129 11/20/12 0304  TROPONINI <0.30 <0.30 <0.30 <0.30   BNP: No components found with this basename: POCBNP,  CBG:  Recent Labs Lab 11/21/12 0847  GLUCAP 100*    Time coordinating discharge:  Greater than 30 minutes  Signed:  TAT, DAVID, DO [facility] Pager: [phone] 11/21/2012, 4:36 PM

## 2012-11-21 NOTE — Progress Notes (Signed)
UR completed 

## 2012-11-22 ENCOUNTER — Ambulatory Visit (HOSPITAL_COMMUNITY)
Admission: RE | Admit: 2012-11-22 | Discharge: 2012-11-22 | Disposition: A | Discharge status: Home / Self Care | Payer: Medicare Other | Source: Ambulatory Visit | Attending: Internal Medicine | Admitting: Internal Medicine

## 2012-11-22 ENCOUNTER — Ambulatory Visit (HOSPITAL_COMMUNITY): Admit: 2012-11-22 | Payer: Medicare Other

## 2012-11-22 DIAGNOSIS — R55 Syncope and collapse: Secondary | ICD-10-CM | POA: Insufficient documentation

## 2012-11-25 NOTE — Procedures (Signed)
EEG NUMBER:  [id number].  REFERRING PHYSICIAN:  Dr. Onalee Hua Tat.  INDICATION FOR STUDY:  A 79 year old man with recurrent spells of apparent syncope.  Study is being performed to rule out possible new onset seizure disorder.  DESCRIPTION:  This is a routine EEG recording performed during wakefulness.  Predominant background activity consisted of 8-9 Hz symmetrical alpha rhythm, which attenuated well with eye opening. Photic stimulation was not performed.  Hyperventilation was not performed.  No epileptiform discharges were recorded.  There were no areas of abnormal slowing.  INTERPRETATION:  This is a normal EEG recording during wakefulness.     Noel Christmas, MD    RU:EAVW D:  11/22/2012 18:16:56  T:  11/23/2012 04:37:02  Job #:  [id number]

## 2012-12-01 ENCOUNTER — Other Ambulatory Visit (INDEPENDENT_AMBULATORY_CARE_PROVIDER_SITE_OTHER): Payer: Medicare Other

## 2012-12-01 ENCOUNTER — Other Ambulatory Visit: Payer: Self-pay | Admitting: *Deleted

## 2012-12-01 ENCOUNTER — Other Ambulatory Visit: Payer: Medicare Other

## 2012-12-01 DIAGNOSIS — D649 Anemia, unspecified: Secondary | ICD-10-CM

## 2012-12-01 LAB — CBC WITH DIFFERENTIAL/PLATELET
Basophils Absolute: 0.1 10*3/uL (ref 0.0–0.1)
Basophils Relative: 0.7 % (ref 0.0–3.0)
Eosinophils Absolute: 0.2 10*3/uL (ref 0.0–0.7)
Lymphocytes Relative: 13.6 % (ref 12.0–46.0)
MCHC: 32.9 g/dL (ref 30.0–36.0)
MCV: 90.8 fl (ref 78.0–100.0)
Monocytes Absolute: 0.8 10*3/uL (ref 0.1–1.0)
Neutrophils Relative %: 73.7 % (ref 43.0–77.0)
Platelets: 331 10*3/uL (ref 150.0–400.0)
RBC: 3.43 Mil/uL — ABNORMAL LOW (ref 4.22–5.81)

## 2012-12-06 ENCOUNTER — Telehealth: Payer: Self-pay | Admitting: Internal Medicine

## 2012-12-06 NOTE — Telephone Encounter (Signed)
Spoke with patient and scheduled him on 12/07/12 at 10:00 AM. Left a message at Dr. Quita Skye office for Calvin Harper to fax [place] records.

## 2012-12-07 ENCOUNTER — Other Ambulatory Visit (INDEPENDENT_AMBULATORY_CARE_PROVIDER_SITE_OTHER): Payer: Medicare Other

## 2012-12-07 ENCOUNTER — Other Ambulatory Visit: Payer: Self-pay

## 2012-12-07 ENCOUNTER — Encounter: Payer: Self-pay | Admitting: Internal Medicine

## 2012-12-07 ENCOUNTER — Ambulatory Visit (INDEPENDENT_AMBULATORY_CARE_PROVIDER_SITE_OTHER): Payer: Medicare Other | Admitting: Internal Medicine

## 2012-12-07 VITALS — BP 116/62 | HR 80 | Ht 75.0 in | Wt 155.0 lb

## 2012-12-07 DIAGNOSIS — R195 Other fecal abnormalities: Secondary | ICD-10-CM

## 2012-12-07 DIAGNOSIS — K5909 Other constipation: Secondary | ICD-10-CM

## 2012-12-07 DIAGNOSIS — K5900 Constipation, unspecified: Secondary | ICD-10-CM

## 2012-12-07 DIAGNOSIS — K5641 Fecal impaction: Secondary | ICD-10-CM

## 2012-12-07 DIAGNOSIS — D649 Anemia, unspecified: Secondary | ICD-10-CM

## 2012-12-07 DIAGNOSIS — D509 Iron deficiency anemia, unspecified: Secondary | ICD-10-CM

## 2012-12-07 LAB — CBC WITH DIFFERENTIAL/PLATELET
Basophils Relative: 0.5 % (ref 0.0–3.0)
Eosinophils Absolute: 0.2 10*3/uL (ref 0.0–0.7)
Eosinophils Relative: 2.4 % (ref 0.0–5.0)
Hemoglobin: 11.1 g/dL — ABNORMAL LOW (ref 13.0–17.0)
Lymphocytes Relative: 18.7 % (ref 12.0–46.0)
Monocytes Relative: 11.2 % (ref 3.0–12.0)
Neutro Abs: 5.6 10*3/uL (ref 1.4–7.7)
Neutrophils Relative %: 67.2 % (ref 43.0–77.0)
RBC: 3.72 Mil/uL — ABNORMAL LOW (ref 4.22–5.81)
WBC: 8.4 10*3/uL (ref 4.5–10.5)

## 2012-12-07 NOTE — Patient Instructions (Addendum)
Your physician has requested that you go to the basement for the following lab work before leaving today: CBC/diff  Today we are giving you align coupons to use.  Dr Stan Head recommends that you complete a bowel purge (to clean out your bowels). Please do the following: Purchase a bottle of Miralax over the counter as well as a box of 5 mg dulcolax tablets. Take 2 dulcolax tablets. Wait 1 hour. You will then drink 4 capfuls of Miralax mixed in an adequate amount of water/juice/gatorade (you may choose which of these liquids to drink) over the next 2-3 hours. You should expect results within 1 to 6 hours after completing the bowel purge.  We are giving you samples of Miralax to get you started.  After doing your bowel purge take a dose of Miralax daily.   Come back to see [place] in 6 weeks.  Thank you for choosing me and [facility].  Iva Boop, M.D., [facility]

## 2012-12-07 NOTE — Progress Notes (Signed)
  Subjective:    Patient ID: Calvin Harper, male    DOB: Apr 13, 1916, 79 y.o.   MRN: [id number]  HPI 80+ yo man here after I saw him in hospital - he had syncope, heme + stool and an iron-deficiency anemia. He was to see PCP and discuss colonoscopy but apparently not to see PCP until June. We discussed colonoscopy, son present, but patient was not inclined to pursue. No syncope since hospital. Struggling with defecation - urge but cannot get stool out. Dark stools on iron. Describes chronic constipation issues and says he developed bad bowel habits while in [place] [place] [employer]  Medications, allergies, past medical history, past surgical history, family history and social history are reviewed and updated in the EMR.  Review of Systems Chronic low back pain, hard of hearing    Objective:   Physical Exam General:  NAD Eyes:   anicteric Rectal: Soft impaction present, no mass Ext:   no edema    Data Reviewed:  Lab Results  Component Value Date   WBC 8.4 12/07/2012   HGB 11.1* 12/07/2012   HCT 34.3* 12/07/2012   MCV 92.3 12/07/2012   PLT 365.0 12/07/2012   Current outpatient prescriptions:acetaminophen (TYLENOL) 500 MG tablet, Take 500 mg by mouth every 6 (six) hours as needed for pain., Disp: , Rfl: ;  acidophilus (RISAQUAD) CAPS, Take 1 capsule by mouth daily., Disp: , Rfl: ;  aspirin 81 MG tablet, Take 81 mg by mouth daily., Disp: , Rfl: ;  ferrous sulfate 325 (65 FE) MG tablet, Take 1 tablet (325 mg total) by mouth 2 (two) times daily with a meal., Disp: , Rfl:  finasteride (PROSCAR) 5 MG tablet, Take 5 mg by mouth Daily. , Disp: , Rfl: ;  pantoprazole (PROTONIX) 40 MG tablet, Take 1 tablet (40 mg total) by mouth daily at 6 (six) AM., Disp: 30 tablet, Rfl: 1;  Tamsulosin HCl (FLOMAX) 0.4 MG CAPS, , Disp: , Rfl:       Assessment & Plan:  Anemia, iron deficiency  Heme + stool  Chronic constipation  Fecal impaction  Resection of ileum for ischemia/infarct and  carcinoid tumor 2012  1. Declines colonoscopy - he understands risks of possible cancer- at his age I understand his wishes. 2. Miralax purge (gentle) 3. CBC 4. Daily MiraLax 5. REV 6 weeks 6. He is to see urology re: optimal Tx of BPH and BOO - tamsulosin could have contributed to syncope - it was favored over the anemia as a cause   BM:[id number],NUUVO August Saucer, MD and Dr. Margarita Grizzle, [facility]

## 2012-12-07 NOTE — Progress Notes (Signed)
Quick Note:  Hgb better - stay on iron Will also release in My Chart ______

## 2013-01-04 ENCOUNTER — Encounter (INDEPENDENT_AMBULATORY_CARE_PROVIDER_SITE_OTHER): Payer: Medicare Other | Admitting: Ophthalmology

## 2013-01-04 DIAGNOSIS — H43819 Vitreous degeneration, unspecified eye: Secondary | ICD-10-CM

## 2013-01-04 DIAGNOSIS — I10 Essential (primary) hypertension: Secondary | ICD-10-CM

## 2013-01-04 DIAGNOSIS — H353290 Exudative age-related macular degeneration, unspecified eye, stage unspecified: Secondary | ICD-10-CM

## 2013-01-04 DIAGNOSIS — H3530 Unspecified macular degeneration: Secondary | ICD-10-CM

## 2013-01-04 DIAGNOSIS — H35039 Hypertensive retinopathy, unspecified eye: Secondary | ICD-10-CM

## 2013-04-19 ENCOUNTER — Other Ambulatory Visit: Payer: Self-pay

## 2013-04-26 ENCOUNTER — Encounter (INDEPENDENT_AMBULATORY_CARE_PROVIDER_SITE_OTHER): Payer: Medicare Other | Admitting: Ophthalmology

## 2013-04-26 DIAGNOSIS — H43819 Vitreous degeneration, unspecified eye: Secondary | ICD-10-CM

## 2013-04-26 DIAGNOSIS — H3530 Unspecified macular degeneration: Secondary | ICD-10-CM

## 2013-04-26 DIAGNOSIS — H2510 Age-related nuclear cataract, unspecified eye: Secondary | ICD-10-CM

## 2013-04-26 DIAGNOSIS — H35039 Hypertensive retinopathy, unspecified eye: Secondary | ICD-10-CM

## 2013-04-26 DIAGNOSIS — H353290 Exudative age-related macular degeneration, unspecified eye, stage unspecified: Secondary | ICD-10-CM

## 2013-04-26 DIAGNOSIS — I10 Essential (primary) hypertension: Secondary | ICD-10-CM

## 2013-07-20 ENCOUNTER — Other Ambulatory Visit: Payer: Self-pay

## 2013-08-16 ENCOUNTER — Encounter (INDEPENDENT_AMBULATORY_CARE_PROVIDER_SITE_OTHER): Payer: Medicare Other | Admitting: Ophthalmology

## 2013-08-16 DIAGNOSIS — H3530 Unspecified macular degeneration: Secondary | ICD-10-CM

## 2013-08-16 DIAGNOSIS — H43819 Vitreous degeneration, unspecified eye: Secondary | ICD-10-CM

## 2013-08-16 DIAGNOSIS — H2510 Age-related nuclear cataract, unspecified eye: Secondary | ICD-10-CM

## 2013-08-16 DIAGNOSIS — H35039 Hypertensive retinopathy, unspecified eye: Secondary | ICD-10-CM

## 2013-08-16 DIAGNOSIS — I10 Essential (primary) hypertension: Secondary | ICD-10-CM

## 2013-08-16 DIAGNOSIS — H353290 Exudative age-related macular degeneration, unspecified eye, stage unspecified: Secondary | ICD-10-CM

## 2013-09-17 ENCOUNTER — Emergency Department (HOSPITAL_COMMUNITY): Payer: Medicare Other

## 2013-09-17 ENCOUNTER — Encounter (HOSPITAL_COMMUNITY): Payer: Self-pay | Admitting: Emergency Medicine

## 2013-09-17 ENCOUNTER — Emergency Department (HOSPITAL_COMMUNITY)
Admission: EM | Admit: 2013-09-17 | Discharge: 2013-09-17 | Disposition: A | Discharge status: Home / Self Care | Payer: Medicare Other | Attending: Emergency Medicine | Admitting: Emergency Medicine

## 2013-09-17 DIAGNOSIS — N400 Enlarged prostate without lower urinary tract symptoms: Secondary | ICD-10-CM | POA: Insufficient documentation

## 2013-09-17 DIAGNOSIS — Z8719 Personal history of other diseases of the digestive system: Secondary | ICD-10-CM | POA: Insufficient documentation

## 2013-09-17 DIAGNOSIS — I10 Essential (primary) hypertension: Secondary | ICD-10-CM | POA: Insufficient documentation

## 2013-09-17 DIAGNOSIS — M549 Dorsalgia, unspecified: Secondary | ICD-10-CM

## 2013-09-17 DIAGNOSIS — M545 Low back pain, unspecified: Secondary | ICD-10-CM | POA: Insufficient documentation

## 2013-09-17 DIAGNOSIS — D649 Anemia, unspecified: Secondary | ICD-10-CM | POA: Insufficient documentation

## 2013-09-17 DIAGNOSIS — R269 Unspecified abnormalities of gait and mobility: Secondary | ICD-10-CM | POA: Insufficient documentation

## 2013-09-17 DIAGNOSIS — M129 Arthropathy, unspecified: Secondary | ICD-10-CM | POA: Insufficient documentation

## 2013-09-17 DIAGNOSIS — Z7982 Long term (current) use of aspirin: Secondary | ICD-10-CM | POA: Insufficient documentation

## 2013-09-17 DIAGNOSIS — Z87448 Personal history of other diseases of urinary system: Secondary | ICD-10-CM | POA: Insufficient documentation

## 2013-09-17 DIAGNOSIS — R5383 Other fatigue: Secondary | ICD-10-CM

## 2013-09-17 DIAGNOSIS — Z880 Allergy status to penicillin: Secondary | ICD-10-CM | POA: Insufficient documentation

## 2013-09-17 DIAGNOSIS — R5381 Other malaise: Secondary | ICD-10-CM | POA: Insufficient documentation

## 2013-09-17 DIAGNOSIS — Z79899 Other long term (current) drug therapy: Secondary | ICD-10-CM | POA: Insufficient documentation

## 2013-09-17 DIAGNOSIS — Z87891 Personal history of nicotine dependence: Secondary | ICD-10-CM | POA: Insufficient documentation

## 2013-09-17 MED ORDER — HYDROCODONE-ACETAMINOPHEN 5-325 MG PO TABS
1.0000 | ORAL_TABLET | Freq: Once | ORAL | Status: AC
Start: 2013-09-17 — End: 2013-09-17
  Administered 2013-09-17: 1 via ORAL
  Filled 2013-09-17: qty 1

## 2013-09-17 NOTE — ED Provider Notes (Signed)
CSN: [id number]     Arrival date & time 09/17/13  1439 History   First MD Initiated Contact with Patient 09/17/13 1554     Chief Complaint  Patient presents with  . Back Pain  . Fatigue   HPI  Is a 80 year old male who presents with low lumbar back pain. He states he has pain and often. Severe sig oh he had an MRI. He states he was given the option of either epidural steroids or doing nothing, as he was told by surgeon that he was not an operative candidate. In no typical steroids. He got minimal relief. His pain has waxed and waned since then. His pain has not been an issue over the last several weeks. This morning he awakened and upon getting out of bed he felt more pain than he was used to. He had difficulty even getting out of bed. He states that he has been up around his pain is better, but still hurts more than it usually does. He has had no falls. No injuries. No trauma. No fevers or chills. He states this morning he felt he was "really slowing "getting up and around", but no additional complaints now.  He states it doesn't hurt "at all" like it did earlier. Pain is well localized. It does not radiate to arms or legs. No change in bowel or bladder habits.   Past Medical History  Diagnosis Date  . Bowel habit changes     inability to control bowels   . Hypertension   . Vascular degeneration   . Spinal stenosis of lumbar region   . Carcinoid tumor of small intestine, benign   . Acute intestinal ischemia   . BPH (benign prostatic hyperplasia)   . Anemia   . Renal failure   . Arthritis    Past Surgical History  Procedure Laterality Date  . Small intestine surgery      removal of part of small intestine  . Lumbar laminectomy     History reviewed. No pertinent family history. History  Substance Use Topics  . Smoking status: Former Research scientist (life sciences)  . Smokeless tobacco: Never Used  . Alcohol Use: No    Review of Systems  Constitutional: Negative for fever, chills, diaphoresis, appetite  change and fatigue.  HENT: Negative for mouth sores, sore throat and trouble swallowing.   Eyes: Negative for visual disturbance.  Respiratory: Negative for cough, chest tightness, shortness of breath and wheezing.   Cardiovascular: Negative for chest pain.  Gastrointestinal: Negative for nausea, vomiting, abdominal pain, diarrhea and abdominal distention.  Endocrine: Negative for polydipsia, polyphagia and polyuria.  Genitourinary: Negative for dysuria, frequency and hematuria.  Musculoskeletal: Positive for back pain, gait problem and myalgias.  Skin: Negative for color change, pallor and rash.  Neurological: Negative for dizziness, syncope, light-headedness and headaches.  Hematological: Does not bruise/bleed easily.  Psychiatric/Behavioral: Negative for behavioral problems and confusion.    Allergies  Morphine and related; Penicillins; and Lyrica  Home Medications   Current Outpatient Rx  Name  Route  Sig  Dispense  Refill  . acetaminophen (TYLENOL) 500 MG tablet   Oral   Take 500 mg by mouth every 6 (six) hours as needed for pain.         Marland Kitchen acidophilus (RISAQUAD) CAPS   Oral   Take 1 capsule by mouth daily.         Marland Kitchen aspirin 81 MG tablet   Oral   Take 81 mg by mouth daily.         Marland Kitchen  ferrous sulfate 325 (65 FE) MG tablet   Oral   Take 1 tablet (325 mg total) by mouth 2 (two) times daily with a meal.         . finasteride (PROSCAR) 5 MG tablet   Oral   Take 5 mg by mouth Daily.          . pantoprazole (PROTONIX) 40 MG tablet   Oral   Take 1 tablet (40 mg total) by mouth daily at 6 (six) AM.   30 tablet   1   . Tamsulosin HCl (FLOMAX) 0.4 MG CAPS                BP 153/76  Pulse 106  Temp(Src) 98 F (36.7 C) (Oral)  Resp 20  SpO2 100% Physical Exam  Constitutional: He is oriented to person, place, and time. He appears well-developed and well-nourished. No distress.  Awake alert. Oriented lucid.  HENT:  Head: Normocephalic.  Eyes:  Conjunctivae are normal. Pupils are equal, round, and reactive to light. No scleral icterus.  Neck: Normal range of motion. Neck supple. No thyromegaly present.  Cardiovascular: Normal rate and regular rhythm.  Exam reveals no gallop and no friction rub.   No murmur heard. Pulmonary/Chest: Effort normal and breath sounds normal. No respiratory distress. He has no wheezes. He has no rales.  Abdominal: Soft. Bowel sounds are normal. He exhibits no distension. There is no tenderness. There is no rebound.  Musculoskeletal: Normal range of motion.       Back:  Neurological: He is alert and oriented to person, place, and time.  He states he has had neuropathy in his legs for years. However he denies any changes in his sensation in his feet. Her exam and he has intact gross sensation to the lower extremities that is symmetric. His strength is symmetric  Skin: Skin is warm and dry. No rash noted.  Psychiatric: He has a normal mood and affect. His behavior is normal.    ED Course  Procedures (including critical care time) Labs Review Labs Reviewed - No data to display Imaging Review Dg Lumbar Spine Complete  09/17/2013   CLINICAL DATA:  Low back pain radiating into both hips with no known trauma  EXAM: LUMBAR SPINE - COMPLETE 4+ VIEW  COMPARISON:  Mild to moderate convex left scoliosis lower lumbar spine. Normal anterior-posterior alignment. Mild L1-2 degenerative disc disease. Moderate L2-3 degenerative disc disease. Severe L3-4 and L4-5 degenerative disc disease. There is degenerative facet change at L4-5 and L5-S1 bilaterally. There is significant aortic calcification. There are no fractures.  FINDINGS: Advanced degenerative change.  No acute findings.  IMPRESSION: Negative.   Electronically Signed   By: Skipper Cliche M.D.   On: 09/17/2013 17:07   Dg Pelvis 1-2 Views  09/17/2013   CLINICAL DATA:  Low back pain radiating into both hips with no known trauma  EXAM: PELVIS - 1-2 VIEW  COMPARISON:   Moderate to severe bilateral hip arthritis. Moderate to severe degenerative change lower lumbar spine due to convex left scoliosis. Mild bilateral sacroiliitis. Bilateral iliac calcification. Fecal retention.  Pelvic bones intact without fracture.  FINDINGS: Significant degenerative changes lumbar spine and bilateral hip joints.  IMPRESSION: Negative.   Electronically Signed   By: Skipper Cliche M.D.   On: 09/17/2013 17:05    EKG Interpretation   None       MDM   1. Back pain    Back pain improved with po vicoden.  X rays with  chronic findings.  No compressions.  Plan is DC c Vicoden Rx and colace.    Tanna Furry, MD 09/17/13 [phone]

## 2013-09-17 NOTE — Discharge Instructions (Signed)
Back Pain, Adult Low back pain is very common. About 1 in 5 people have back pain.The cause of low back pain is rarely dangerous. The pain often gets better over time.About half of people with a sudden onset of back pain feel better in just 2 weeks. About 8 in 10 people feel better by 6 weeks.  CAUSES Some common causes of back pain include:  Strain of the muscles or ligaments supporting the spine.  Wear and tear (degeneration) of the spinal discs.  Arthritis.  Direct injury to the back. DIAGNOSIS Most of the time, the direct cause of low back pain is not known.However, back pain can be treated effectively even when the exact cause of the pain is unknown.Answering your caregiver's questions about your overall health and symptoms is one of the most accurate ways to make sure the cause of your pain is not dangerous. If your caregiver needs more information, he or she may order lab work or imaging tests (X-rays or MRIs).However, even if imaging tests show changes in your back, this usually does not require surgery. HOME CARE INSTRUCTIONS For many people, back pain returns.Since low back pain is rarely dangerous, it is often a condition that people can learn to manageon their own.   Remain active. It is stressful on the back to sit or stand in one place. Do not sit, drive, or stand in one place for more than 30 minutes at a time. Take short walks on level surfaces as soon as pain allows.Try to increase the length of time you walk each day.  Do not stay in bed.Resting more than 1 or 2 days can delay your recovery.  Do not avoid exercise or work.Your body is made to move.It is not dangerous to be active, even though your back may hurt.Your back will likely heal faster if you return to being active before your pain is gone.  Pay attention to your body when you bend and lift. Many people have less discomfortwhen lifting if they bend their knees, keep the load close to their bodies,and  avoid twisting. Often, the most comfortable positions are those that put less stress on your recovering back.  Find a comfortable position to sleep. Use a firm mattress and lie on your side with your knees slightly bent. If you lie on your back, put a pillow under your knees.  Only take over-the-counter or prescription medicines as directed by your caregiver. Over-the-counter medicines to reduce pain and inflammation are often the most helpful.Your caregiver may prescribe muscle relaxant drugs.These medicines help dull your pain so you can more quickly return to your normal activities and healthy exercise.  Put ice on the injured area.  Put ice in a plastic bag.  Place a towel between your skin and the bag.  Leave the ice on for 15-20 minutes, 03-04 times a day for the first 2 to 3 days. After that, ice and heat may be alternated to reduce pain and spasms.  Ask your caregiver about trying back exercises and gentle massage. This may be of some benefit.  Avoid feeling anxious or stressed.Stress increases muscle tension and can worsen back pain.It is important to recognize when you are anxious or stressed and learn ways to manage it.Exercise is a great option. SEEK MEDICAL CARE IF:  You have pain that is not relieved with rest or medicine.  You have pain that does not improve in 1 week.  You have new symptoms.  You are generally not feeling well. SEEK   IMMEDIATE MEDICAL CARE IF:   You have pain that radiates from your back into your legs.  You develop new bowel or bladder control problems.  You have unusual weakness or numbness in your arms or legs.  You develop nausea or vomiting.  You develop abdominal pain.  You feel faint. Document Released: 08/31/2005 Document Revised: 03/01/2012 Document Reviewed: 01/19/2011 [employer] Patient Information 2014 [employer].  

## 2013-09-17 NOTE — ED Notes (Signed)
Pt complains of worsening back pain this am. Pt had difficulty arising from chair by self. Pt denies any acute illness, but son states pt usually does not walk with walker, but is today. Pt states back pain is normal for him at present. Ambulatory with wheelchair.

## 2013-09-21 IMAGING — CT CT HEAD W/O CM
2 series · 16 of 30 positions shown, 19 images · non-contrast
Comparison: MRI of the brain 01/03/2004.

CLINICAL DATA: Syncope.  Intermittent headache and dizziness.

CT HEAD WITHOUT CONTRAST
TECHNIQUE: Contiguous axial images were obtained from the base of
the skull through the vertex without contrast.

[Series 2: head w/o · axial · non-contrast · 0.43mm/px · z∈[-127,-7]mm · 9 of 32 slices shown, 12 images]
[im 4/32  brain]
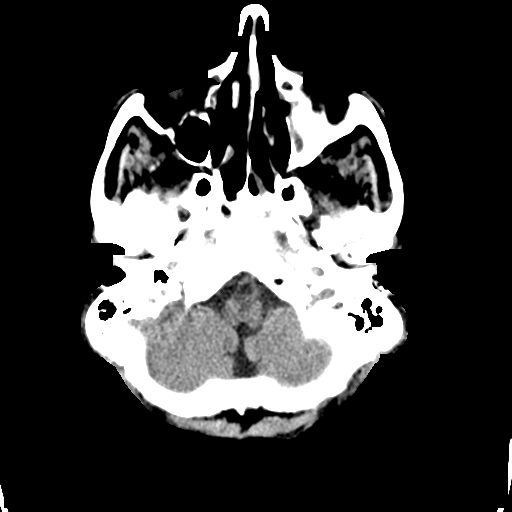
[im 4/32  bone]
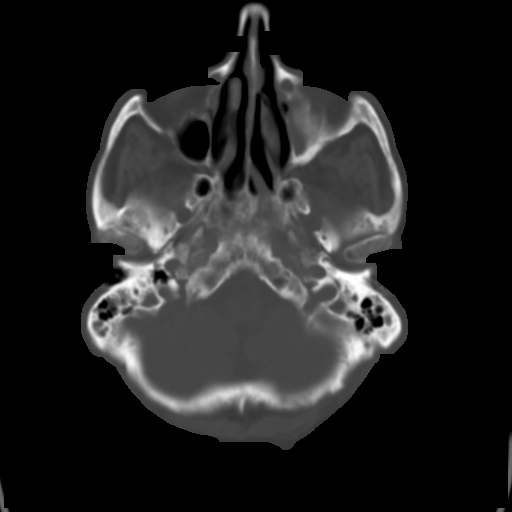
[im 7/32  brain]
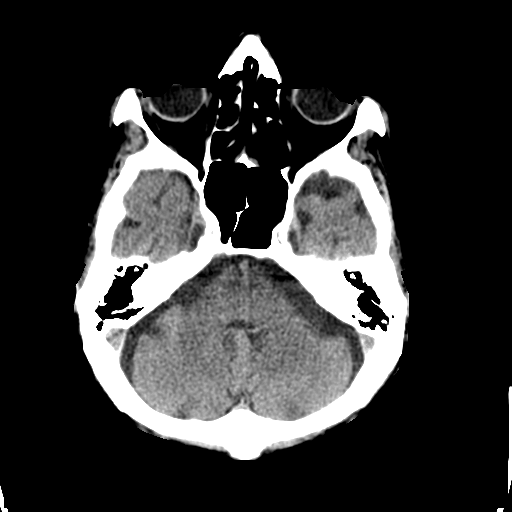
[im 10/32  brain]
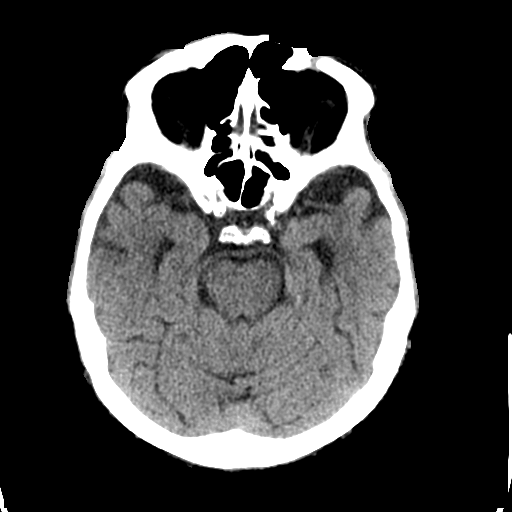
[im 13/32  brain]
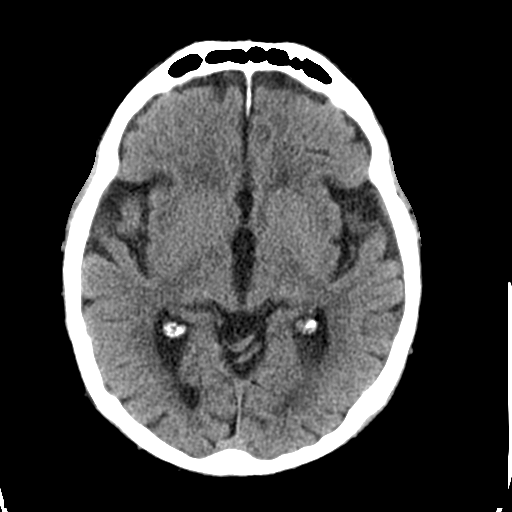
[im 16/32  brain]
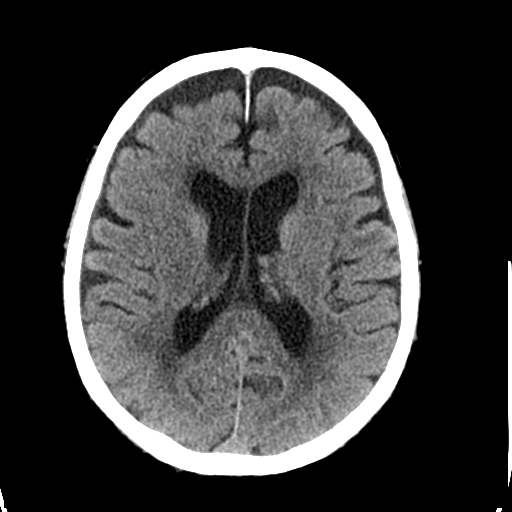
[im 16/32  bone]
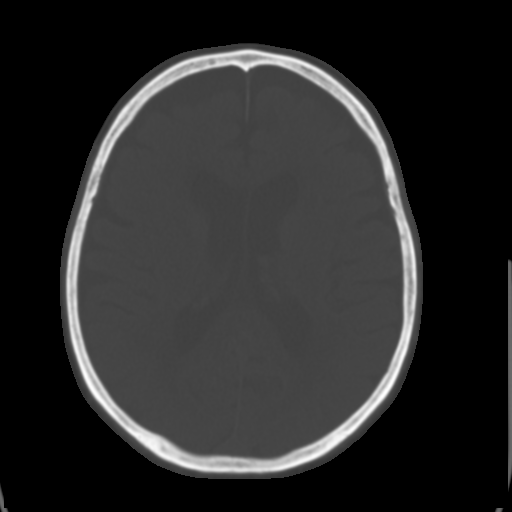
[im 19/32  brain]
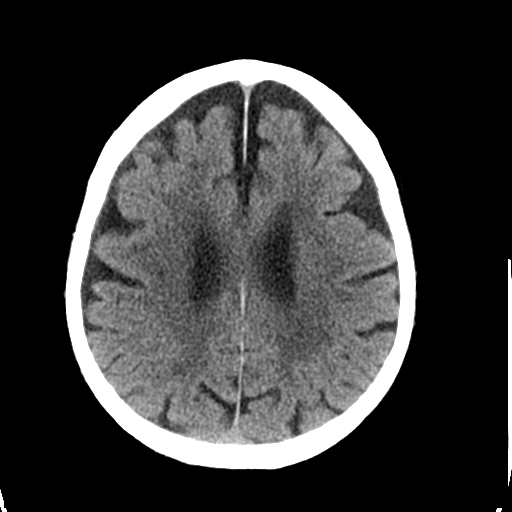
[im 22/32  brain]
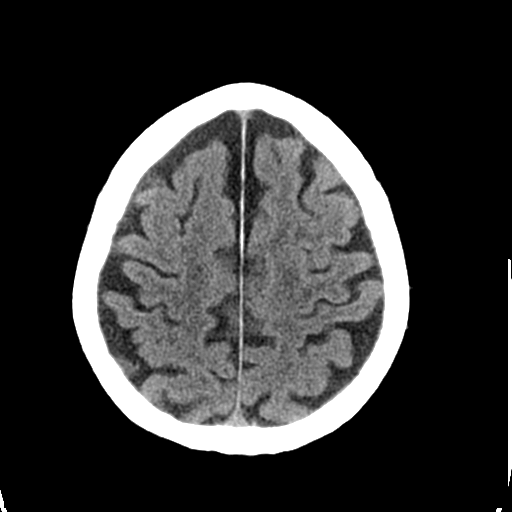
[im 25/32  brain]
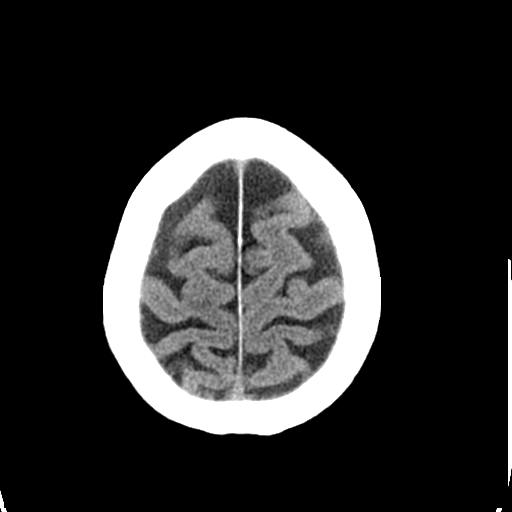
[im 28/32  brain]
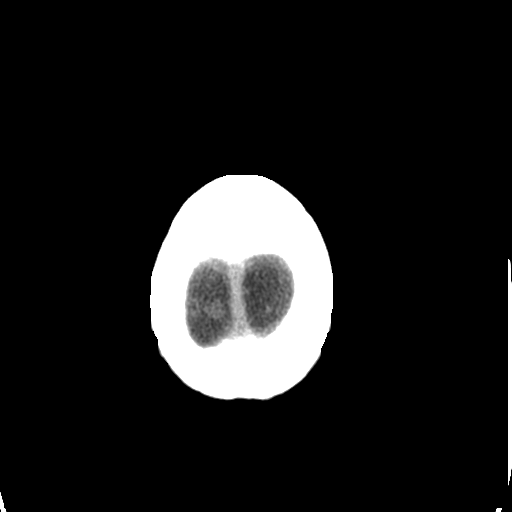
[im 28/32  bone]
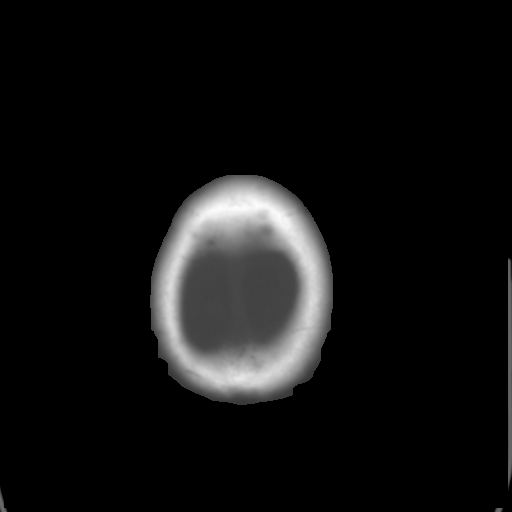

[Series 3: bone windows · axial · 0.43mm/px · z∈[-127,-25]mm · 7 of 52 slices shown]
[im 6/52  bone]
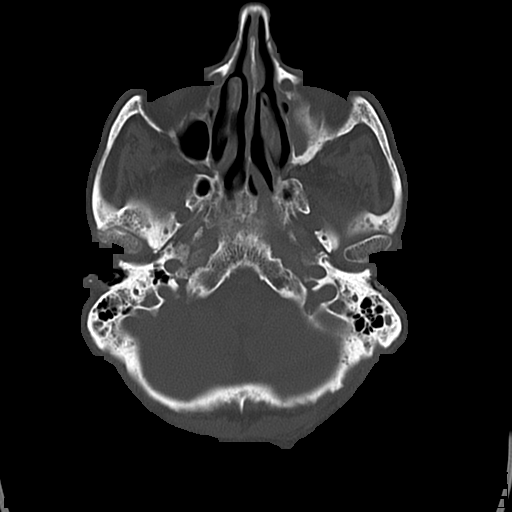
[im 12/52  bone]
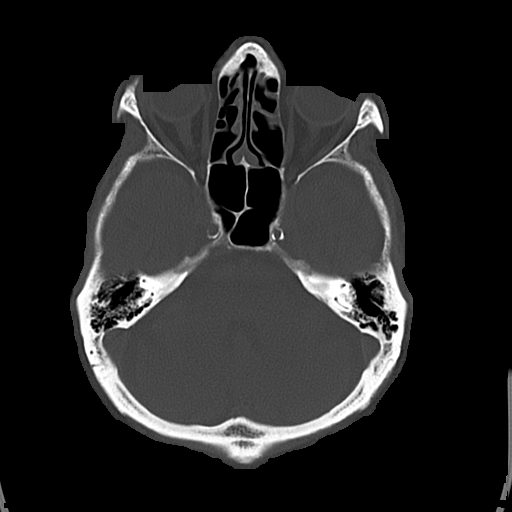
[im 18/52  bone]
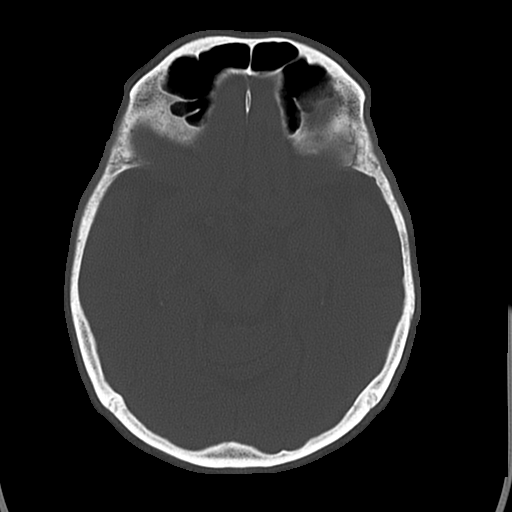
[im 23/52  bone]
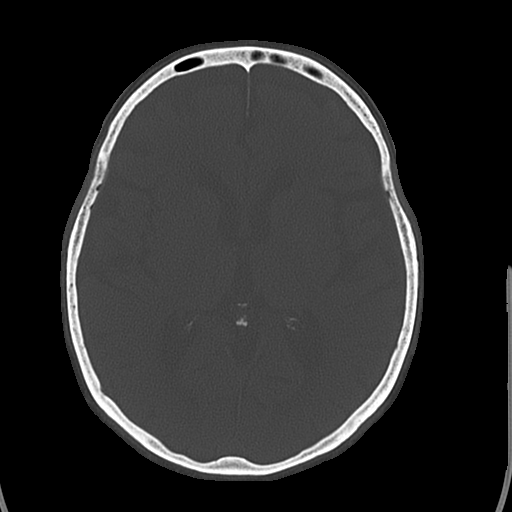
[im 29/52  bone]
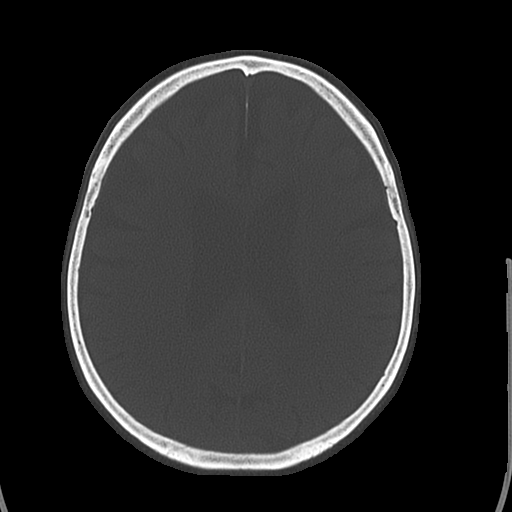
[im 35/52  bone]
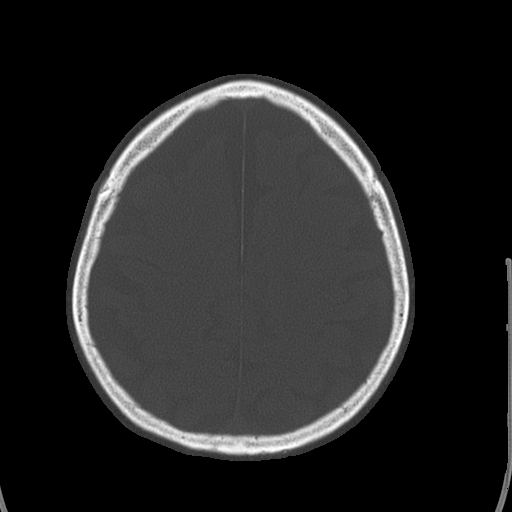
[im 40/52  bone]
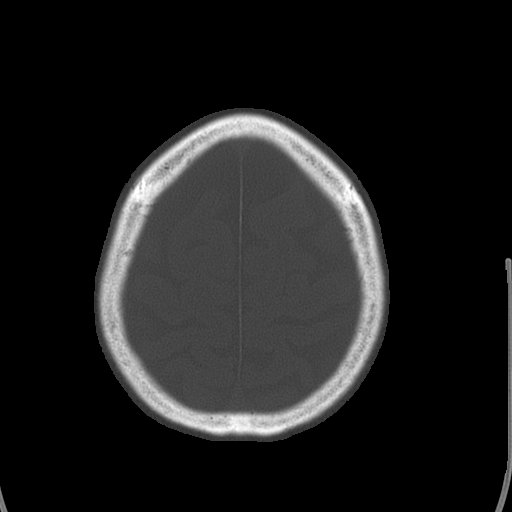

[16 of 30 positions shown; findings below may reference images not displayed]

FINDINGS: Moderate cerebral and cerebellar atrophy.  Patchy and
confluent areas of decreased attenuation throughout the deep and
periventricular white matter of the cerebral hemispheres
bilaterally, most compatible with chronic microvascular ischemic
disease. No acute intracranial abnormalities.  Specifically, no
evidence of acute intracranial hemorrhage, no definite findings of
acute/subacute cerebral ischemia, no mass, mass effect,
hydrocephalus or abnormal intra or extra-axial fluid collections.
The mastoids are generally well pneumatized bilaterally, although
there is a trace amount of fluid in the mastoids posteriorly
bilaterally.  Mild mucosal thickening scattered throughout the
ethmoid sinuses bilaterally, with extensive mucoperiosteal
thickening in the left maxillary sinus which is nearly completely
opacified with some high attenuation secretions (likely inspissated
secretions).
IMPRESSION: 1.  No acute intracranial abnormalities.
2.  Moderate cerebral and cerebellar atrophy with extensive chronic
microvascular ischemic changes in the deep and periventricular
white matter of the cerebral hemispheres bilaterally.
3.  Paranasal sinus disease, as above, including evidence of
chronic sinusitis in the left maxillary sinus.

## 2013-10-03 ENCOUNTER — Telehealth: Payer: Self-pay

## 2013-10-03 NOTE — Telephone Encounter (Signed)
Patient past away @ Home per [facility] in [place] °

## 2013-10-15 DEATH — deceased

## 2013-12-06 ENCOUNTER — Encounter (INDEPENDENT_AMBULATORY_CARE_PROVIDER_SITE_OTHER): Payer: Medicare Other | Admitting: Ophthalmology

## 2014-07-20 IMAGING — CR DG PELVIS 1-2V
1 series · 1 of 1 positions shown · non-contrast
Comparison: Moderate to severe bilateral hip arthritis.

CLINICAL DATA: Low back pain radiating into both hips with no known
trauma

EXAM:
PELVIS - 1-2 VIEW

[t pelvis ap]
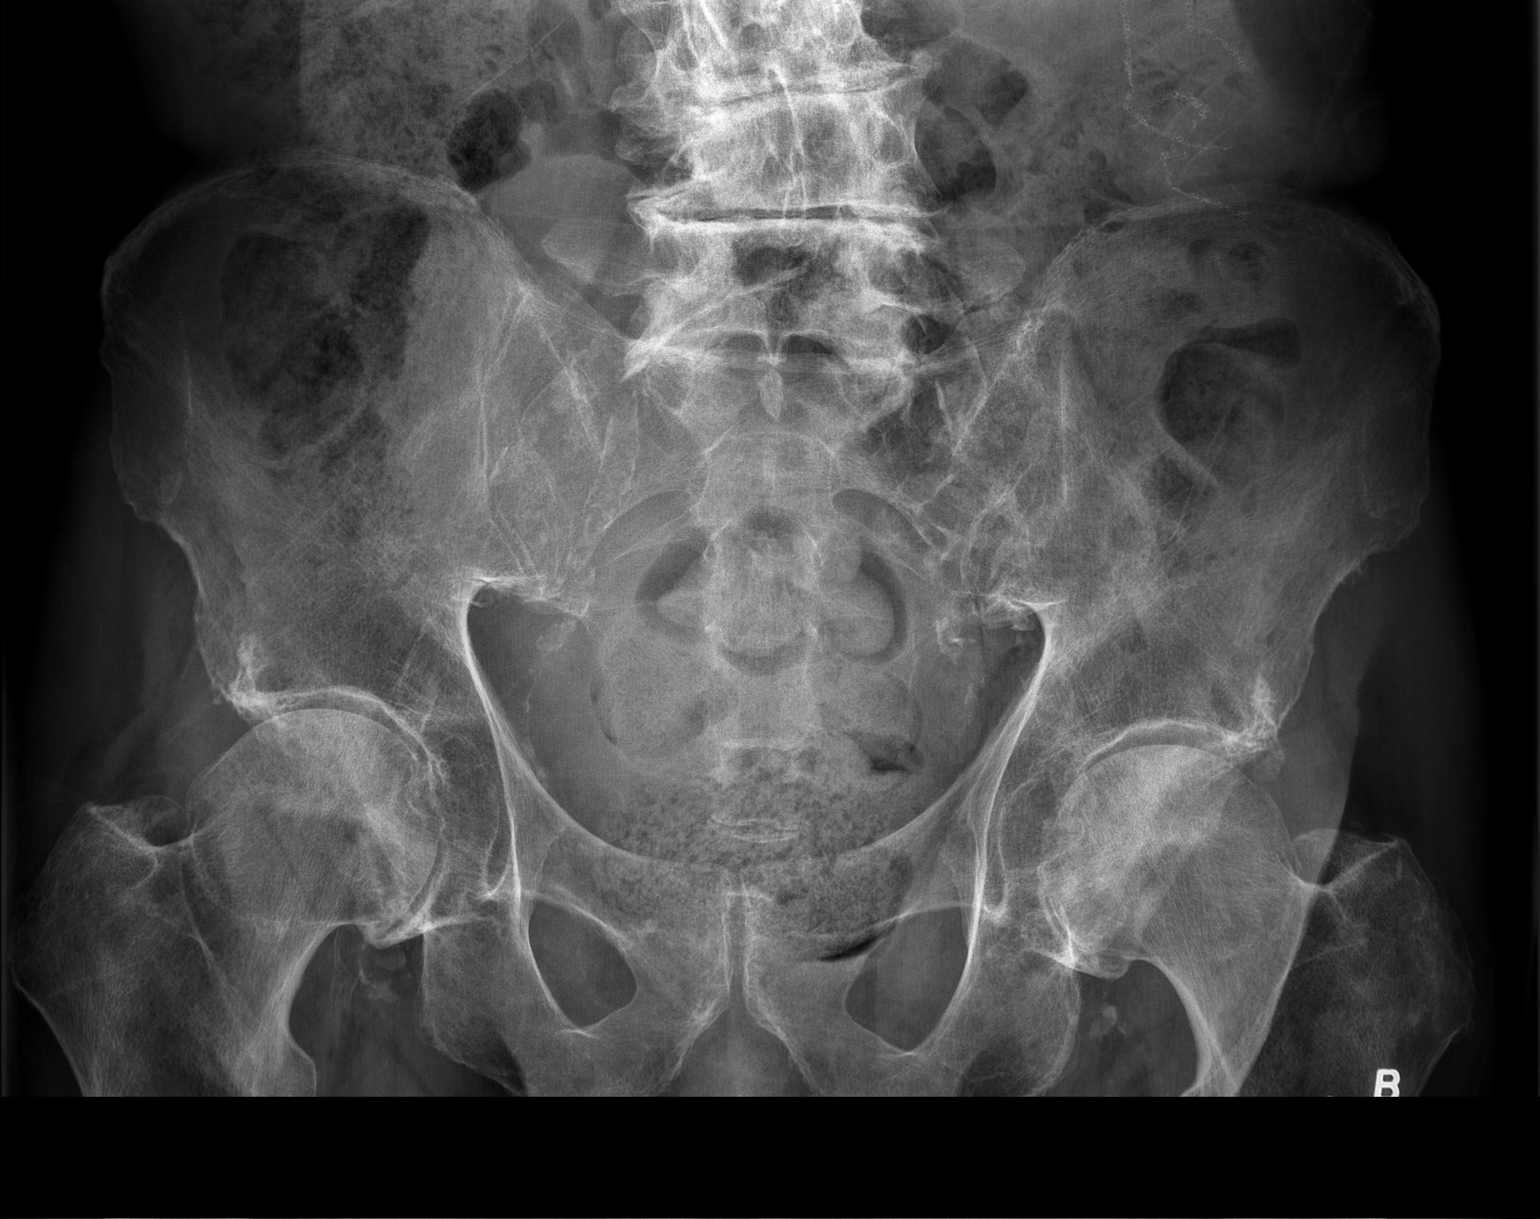

[1 of 1 positions shown; findings below may reference images not displayed]

Moderate to
severe degenerative change lower lumbar spine due to convex left
scoliosis. Mild bilateral sacroiliitis. Bilateral iliac
calcification. Fecal retention.

Pelvic bones intact without fracture.
FINDINGS: Significant degenerative changes lumbar spine and bilateral hip
joints.
IMPRESSION: Negative.
